# Patient Record
Sex: Female | Born: 1965 | Hispanic: No | Marital: Married | Smoking: Never smoker
Health system: Southern US, Community
[De-identification: ages and names within clinical notes are randomized; demographics above are authoritative.]

## PROBLEM LIST (undated history)

## (undated) DIAGNOSIS — E785 Hyperlipidemia, unspecified: Secondary | ICD-10-CM

## (undated) DIAGNOSIS — K5900 Constipation, unspecified: Secondary | ICD-10-CM

## (undated) DIAGNOSIS — E119 Type 2 diabetes mellitus without complications: Secondary | ICD-10-CM

## (undated) DIAGNOSIS — T7840XA Allergy, unspecified, initial encounter: Secondary | ICD-10-CM

## (undated) DIAGNOSIS — I10 Essential (primary) hypertension: Secondary | ICD-10-CM

## (undated) DIAGNOSIS — D649 Anemia, unspecified: Secondary | ICD-10-CM

## (undated) DIAGNOSIS — G629 Polyneuropathy, unspecified: Secondary | ICD-10-CM

## (undated) HISTORY — DX: Essential (primary) hypertension: I10

## (undated) HISTORY — DX: Anemia, unspecified: D64.9

## (undated) HISTORY — DX: Hyperlipidemia, unspecified: E78.5

## (undated) HISTORY — DX: Polyneuropathy, unspecified: G62.9

## (undated) HISTORY — DX: Constipation, unspecified: K59.00

## (undated) HISTORY — DX: Type 2 diabetes mellitus without complications: E11.9

## (undated) HISTORY — DX: Allergy, unspecified, initial encounter: T78.40XA

## (undated) HISTORY — PX: BREAST SURGERY: SHX581

---

## 1995-11-21 HISTORY — PX: TUBAL LIGATION: SHX77

## 2002-02-14 ENCOUNTER — Encounter: Admission: RE | Admit: 2002-02-14 | Discharge: 2002-02-14 | Payer: Self-pay | Admitting: Internal Medicine

## 2002-02-14 ENCOUNTER — Encounter: Payer: Self-pay | Admitting: Internal Medicine

## 2003-01-16 ENCOUNTER — Encounter: Admission: RE | Admit: 2003-01-16 | Discharge: 2003-01-16 | Payer: Self-pay | Admitting: Internal Medicine

## 2003-01-16 ENCOUNTER — Encounter: Payer: Self-pay | Admitting: Internal Medicine

## 2003-01-16 HISTORY — PX: BREAST CYST ASPIRATION: SHX578

## 2007-11-21 LAB — HM PAP SMEAR: HM Pap smear: NORMAL

## 2008-10-20 LAB — HM MAMMOGRAPHY

## 2008-11-16 ENCOUNTER — Ambulatory Visit (HOSPITAL_COMMUNITY): Admission: RE | Admit: 2008-11-16 | Discharge: 2008-11-16 | Payer: Self-pay | Admitting: Internal Medicine

## 2008-12-14 ENCOUNTER — Encounter: Admission: RE | Admit: 2008-12-14 | Discharge: 2008-12-14 | Payer: Self-pay | Admitting: Internal Medicine

## 2010-12-11 ENCOUNTER — Encounter: Payer: Self-pay | Admitting: Internal Medicine

## 2012-03-22 ENCOUNTER — Other Ambulatory Visit: Payer: Self-pay | Admitting: Internal Medicine

## 2012-03-22 DIAGNOSIS — R920 Mammographic microcalcification found on diagnostic imaging of breast: Secondary | ICD-10-CM

## 2012-06-05 ENCOUNTER — Other Ambulatory Visit: Payer: Self-pay | Admitting: Obstetrics & Gynecology

## 2012-06-05 DIAGNOSIS — R920 Mammographic microcalcification found on diagnostic imaging of breast: Secondary | ICD-10-CM

## 2012-06-10 ENCOUNTER — Ambulatory Visit
Admission: RE | Admit: 2012-06-10 | Discharge: 2012-06-10 | Disposition: A | Discharge status: Home / Self Care | Payer: PRIVATE HEALTH INSURANCE | Source: Ambulatory Visit | Attending: Obstetrics & Gynecology | Admitting: Obstetrics & Gynecology

## 2012-06-10 DIAGNOSIS — R920 Mammographic microcalcification found on diagnostic imaging of breast: Secondary | ICD-10-CM

## 2012-06-27 LAB — COMPREHENSIVE METABOLIC PANEL
ALT: 16 U/L (ref 7–35)
AST: 17 U/L
Calcium: 9.8 mg/dL
Chloride: 102 mmol/L
Creat: 0.79
Potassium: 4.1 mmol/L

## 2012-06-27 LAB — ESTRADIOL: Estradiol: 14.2

## 2012-06-27 LAB — THYROID PANEL WITH TSH
T3 Uptake: 28.6
T4, Total: 7.6

## 2012-06-27 LAB — RPR: RPR: NONREACTIVE

## 2012-06-27 LAB — LIPID PANEL
Total CHOL/HDL Ratio: 3.6
VLDL: 14 mg/dL

## 2012-07-12 ENCOUNTER — Ambulatory Visit (INDEPENDENT_AMBULATORY_CARE_PROVIDER_SITE_OTHER): Payer: BC Managed Care – PPO | Admitting: Internal Medicine

## 2012-07-12 ENCOUNTER — Encounter: Payer: Self-pay | Admitting: Internal Medicine

## 2012-07-12 VITALS — BP 150/102 | HR 74 | Temp 97.0°F | Resp 16 | Ht 62.0 in | Wt 161.0 lb

## 2012-07-12 DIAGNOSIS — E1110 Type 2 diabetes mellitus with ketoacidosis without coma: Secondary | ICD-10-CM

## 2012-07-12 DIAGNOSIS — I10 Essential (primary) hypertension: Secondary | ICD-10-CM

## 2012-07-12 DIAGNOSIS — E1310 Other specified diabetes mellitus with ketoacidosis without coma: Secondary | ICD-10-CM

## 2012-07-12 MED ORDER — AMLODIPINE BESYLATE-VALSARTAN 10-160 MG PO TABS: 1.0000 | ORAL_TABLET | Freq: Every day | ORAL | Status: DC

## 2012-07-12 NOTE — Patient Instructions (Signed)

## 2012-07-12 NOTE — Assessment & Plan Note (Signed)
She will start exforge to control her BP

## 2012-07-12 NOTE — Progress Notes (Signed)
  Subjective:    Patient ID: Alexandria Noble, female    DOB: 1966-07-31, 46 y.o.   MRN: [id number]  Hypertension This is a new problem. The current episode started today. The problem is unchanged. The problem is uncontrolled. Pertinent negatives include no anxiety, blurred vision, chest pain, headaches, malaise/fatigue, neck pain, orthopnea, palpitations, peripheral edema, PND, shortness of breath or sweats. There are no associated agents to hypertension. Past treatments include nothing. Compliance problems include exercise and diet.       Review of Systems  Constitutional: Negative.  Negative for malaise/fatigue.  HENT: Negative.  Negative for neck pain.   Eyes: Negative.  Negative for blurred vision.  Respiratory: Negative for cough, chest tightness, shortness of breath, wheezing and stridor.   Cardiovascular: Negative for chest pain, palpitations, orthopnea, leg swelling and PND.  Gastrointestinal: Negative.   Genitourinary: Negative for dysuria, urgency, frequency, hematuria, flank pain, decreased urine volume, enuresis, difficulty urinating and dyspareunia.  Musculoskeletal: Negative.   Skin: Negative.   Neurological: Negative for dizziness, facial asymmetry, light-headedness and headaches.  Hematological: Negative.   Psychiatric/Behavioral: Negative.        Objective:   Physical Exam  Vitals reviewed. Constitutional: She is oriented to person, place, and time. She appears well-developed and well-nourished. No distress.  HENT:  Head: Normocephalic and atraumatic.  Mouth/Throat: Oropharynx is clear and moist. No oropharyngeal exudate.  Eyes: Conjunctivae are normal. Right eye exhibits no discharge. Left eye exhibits no discharge. No scleral icterus.  Neck: Normal range of motion. Neck supple. No JVD present. No tracheal deviation present. No thyromegaly present.  Cardiovascular: Normal rate, regular rhythm, normal heart sounds and intact distal pulses.  Exam reveals no gallop  and no friction rub.   No murmur heard. Pulmonary/Chest: Effort normal and breath sounds normal. No stridor. No respiratory distress. She has no wheezes. She has no rales. She exhibits no tenderness.  Abdominal: Soft. Bowel sounds are normal. She exhibits no distension and no mass. There is no tenderness. There is no rebound and no guarding.  Musculoskeletal: Normal range of motion. She exhibits no edema and no tenderness.  Lymphadenopathy:    She has no cervical adenopathy.  Neurological: She is oriented to person, place, and time.  Skin: Skin is warm and dry. No rash noted. She is not diaphoretic. No erythema. No pallor.  Psychiatric: She has a normal mood and affect. Her behavior is normal. Judgment and thought content normal.     Lab Results  Component Value Date   CHOL 196 06/27/2012   TRIG 71 06/27/2012   HDL 54 06/27/2012   LDLCALC 409 06/27/2012   ALT 16 06/05/2012   AST 17 06/05/2012   NA 140 06/05/2012   K 4.1 06/05/2012   CL 102 06/05/2012   CREATININE 0.79 06/05/2012   BUN 18 06/05/2012   CO2 27 06/05/2012   TSH 3.631 06/06/2012       Assessment & Plan:

## 2014-03-04 ENCOUNTER — Other Ambulatory Visit: Payer: Self-pay

## 2014-03-26 ENCOUNTER — Ambulatory Visit (INDEPENDENT_AMBULATORY_CARE_PROVIDER_SITE_OTHER): Payer: BC Managed Care – PPO | Admitting: Obstetrics & Gynecology

## 2014-03-26 ENCOUNTER — Encounter: Payer: Self-pay | Admitting: Obstetrics & Gynecology

## 2014-03-26 VITALS — BP 146/102 | HR 85 | Temp 97.4°F | Ht 62.0 in | Wt 161.0 lb

## 2014-03-26 DIAGNOSIS — Z1231 Encounter for screening mammogram for malignant neoplasm of breast: Secondary | ICD-10-CM

## 2014-03-26 DIAGNOSIS — Z113 Encounter for screening for infections with a predominantly sexual mode of transmission: Secondary | ICD-10-CM

## 2014-03-26 DIAGNOSIS — Z780 Asymptomatic menopausal state: Secondary | ICD-10-CM

## 2014-03-26 DIAGNOSIS — Z01419 Encounter for gynecological examination (general) (routine) without abnormal findings: Secondary | ICD-10-CM

## 2014-03-26 NOTE — Progress Notes (Signed)
Subjective:     Alexandria Noble is a 48 y.o. female here for a routine exam.    Personal health questionnaire:  Is patient Ashkenazi Jewish, have a family history of breast and/or ovarian cancer: yes Is there a family history of uterine cancer diagnosed at age < 6050, gastrointestinal cancer, urinary tract cancer, family member who is a Personnel officerLynch syndrome-associated carrier: no Is the patient overweight and hypertensive, family history of diabetes, personal history of gestational diabetes or PCOS: yes Is patient over [address]55, have PCOS,  family history of premature CHD under age 48, diabetes, smoke, have hypertension or peripheral artery disease:  yes   Gynecologic History No LMP recorded. Patient is postmenopausal.  Last Pap Results were: abnormal Last mammogram results were: normal  Obstetric History OB History  Gravida Para Term Preterm AB SAB TAB Ectopic Multiple Living  3 2 2  1     2     # Outcome Date GA Lbr Len/2nd Weight Sex Delivery Anes PTL Lv  3 TRM 03/30/92 3130w0d  3.629 kg (8 lb) F SVD None N Y  2 TRM 12/27/87 4852w0d  2.835 kg (6 lb 4 oz) M SVD None N Y  1 ABT               Past Medical History  Diagnosis Date  . Hypertension     Past Surgical History  Procedure Laterality Date  . Tubal ligation  1997  . Breast surgery      No current outpatient prescriptions on file. No Known Allergies  History  Substance Use Topics  . Smoking status: Never Smoker   . Smokeless tobacco: Never Used  . Alcohol Use: No    Family History  Problem Relation Age of Onset  . Breast cancer Mother   . Heart disease Mother   . Hypertension Mother   . Heart disease Father   . Hypertension Father   . Stroke Maternal Grandmother   . Alcohol abuse Neg Hx   . Diabetes Neg Hx       Review of Systems  Constitutional: negative for fatigue and weight loss Respiratory: negative for cough and wheezing Cardiovascular: negative for chest pain, fatigue and palpitations Gastrointestinal:  negative for abdominal pain and change in bowel habits Musculoskeletal:negative for myalgias Neurological: negative for gait problems and tremors Behavioral/Psych: negative for abusive relationship, depression Endocrine: negative for temperature intolerance   Genitourinary:negative for abnormal menstrual periods, genital lesions, hot flashes, sexual problems and vaginal discharge Integument/breast: negative for breast lump, breast tenderness, nipple discharge and skin lesion(s)    Objective:       BP 146/102  Pulse 85  Temp(Src) 97.4 F (36.3 C)  Ht 5\' 2"  (1.575 m)  Wt 73.029 kg (161 lb)  BMI 29.44 kg/m2 General:   alert  Skin:   no rash or abnormalities  Lungs:   clear to auscultation bilaterally  Heart:   regular rate and rhythm, S1, S2 normal, no murmur, click, rub or gallop  Breasts:   normal without suspicious masses, skin or nipple changes or axillary nodes  Abdomen:  normal findings: no organomegaly, soft, non-tender and no hernia  Pelvis:  External genitalia: normal general appearance Urinary system: urethral meatus normal and bladder without fullness, nontender Vaginal: normal without tenderness, induration or masses Cervix: normal appearance Adnexa: normal bimanual exam Uterus: anteverted and non-tender, normal size   Lab Review  Labs reviewed no Radiologic studies reviewed no     Assessment:    Healthy female exam.  POF Plan:   Dondra SpryGail model testing done--referral-->[facility] for genetics counseling Counseled regarding, diet, exercise, bone health, screening MMG  No orders of the defined types were placed in this encounter.   Orders Placed This Encounter  Procedures  . DG Bone Density    EPIC ORDER PF NONE,     NO NEEDS    NP/PT    BCBS    Standing Status: Future     Number of Occurrences:      Standing Expiration Date: 05/27/2015    Order Specific Question:  Reason for Exam (SYMPTOM  OR DIAGNOSIS REQUIRED)    Answer:  postmenopausal ovarian  disfunction    Order Specific Question:  Is the patient pregnant?    Answer:  No    Order Specific Question:  Preferred [facility]?    [facility]  . HIV antibody    Follow up as needed.

## 2014-03-27 ENCOUNTER — Encounter: Payer: Self-pay | Admitting: Obstetrics & Gynecology

## 2014-03-27 ENCOUNTER — Telehealth: Payer: Self-pay | Admitting: Genetic Counselor

## 2014-03-27 LAB — HIV ANTIBODY (ROUTINE TESTING W REFLEX): HIV: NONREACTIVE

## 2014-03-27 NOTE — Telephone Encounter (Signed)
LEFT MESSAGE FOR PATIENT TO RETURN CALL TO SCHEDULE FOR GENETIC COUNSELING.

## 2014-04-02 ENCOUNTER — Other Ambulatory Visit: Payer: Self-pay

## 2014-04-02 DIAGNOSIS — Z1231 Encounter for screening mammogram for malignant neoplasm of breast: Secondary | ICD-10-CM

## 2014-04-02 DIAGNOSIS — Z803 Family history of malignant neoplasm of breast: Secondary | ICD-10-CM

## 2014-04-06 ENCOUNTER — Telehealth: Payer: Self-pay | Admitting: Genetic Counselor

## 2014-04-06 NOTE — Telephone Encounter (Signed)
LEFT MESSAGE FOR PATIENT TO RETURN CALL TO SCHEDULE GENETIC APPT.  °

## 2014-04-23 ENCOUNTER — Ambulatory Visit
Admission: RE | Admit: 2014-04-23 | Discharge: 2014-04-23 | Disposition: A | Discharge status: Home / Self Care | Payer: BC Managed Care – PPO | Source: Ambulatory Visit

## 2014-04-23 ENCOUNTER — Ambulatory Visit
Admission: RE | Admit: 2014-04-23 | Discharge: 2014-04-23 | Disposition: A | Discharge status: Home / Self Care | Payer: BC Managed Care – PPO | Source: Ambulatory Visit | Attending: Obstetrics & Gynecology | Admitting: Obstetrics & Gynecology

## 2014-04-23 DIAGNOSIS — Z803 Family history of malignant neoplasm of breast: Secondary | ICD-10-CM

## 2014-04-23 DIAGNOSIS — Z1231 Encounter for screening mammogram for malignant neoplasm of breast: Secondary | ICD-10-CM

## 2014-04-23 DIAGNOSIS — Z780 Asymptomatic menopausal state: Secondary | ICD-10-CM

## 2014-04-27 ENCOUNTER — Other Ambulatory Visit: Payer: Self-pay | Admitting: Obstetrics & Gynecology

## 2014-04-27 DIAGNOSIS — R928 Other abnormal and inconclusive findings on diagnostic imaging of breast: Secondary | ICD-10-CM

## 2014-05-01 ENCOUNTER — Ambulatory Visit
Admission: RE | Admit: 2014-05-01 | Discharge: 2014-05-01 | Disposition: A | Discharge status: Home / Self Care | Payer: BC Managed Care – PPO | Source: Ambulatory Visit | Attending: Obstetrics & Gynecology | Admitting: Obstetrics & Gynecology

## 2014-05-01 DIAGNOSIS — R928 Other abnormal and inconclusive findings on diagnostic imaging of breast: Secondary | ICD-10-CM

## 2014-05-04 ENCOUNTER — Telehealth: Payer: Self-pay | Admitting: *Deleted

## 2014-05-04 ENCOUNTER — Other Ambulatory Visit: Payer: Self-pay | Admitting: *Deleted

## 2014-05-04 NOTE — Telephone Encounter (Signed)
If Rx to be sent to pharmace, [facility] [address]

## 2014-05-04 NOTE — Telephone Encounter (Signed)
Pt called into office requesting refill on her Brisdelle 7.5mg .  Return call to pt.  Pt states that she was given samples in office.  Pt would like to know if we need to send Rx to pharmacy or can she get more samples.  Please advise.

## 2014-05-06 ENCOUNTER — Encounter: Payer: Self-pay | Admitting: Obstetrics & Gynecology

## 2014-05-07 ENCOUNTER — Encounter: Payer: Self-pay | Admitting: *Deleted

## 2014-05-07 NOTE — Telephone Encounter (Signed)
Pt calling back in regards to refill of Brisdelle.  Please advise

## 2014-05-11 ENCOUNTER — Encounter: Payer: Self-pay | Admitting: Obstetrics & Gynecology

## 2014-05-13 ENCOUNTER — Other Ambulatory Visit: Payer: Self-pay | Admitting: *Deleted

## 2014-05-13 MED ORDER — PAROXETINE MESYLATE 7.5 MG PO CAPS: 1.0000 | ORAL_CAPSULE | Freq: Every day | ORAL | Status: AC

## 2014-05-13 NOTE — Telephone Encounter (Signed)
Rx has been sent to pharmacy.  Pt made aware and that it may also require prior approval due to insurance.  Pt states understanding.  Pt has also picked up additional samples until it can be approved.

## 2014-05-13 NOTE — Telephone Encounter (Signed)
OK to refill

## 2014-09-21 ENCOUNTER — Encounter: Payer: Self-pay | Admitting: Obstetrics & Gynecology

## 2014-09-24 ENCOUNTER — Other Ambulatory Visit: Payer: Self-pay | Admitting: Obstetrics & Gynecology

## 2014-09-24 DIAGNOSIS — N6310 Unspecified lump in the right breast, unspecified quadrant: Secondary | ICD-10-CM

## 2014-10-27 ENCOUNTER — Other Ambulatory Visit: Payer: Self-pay

## 2014-10-27 ENCOUNTER — Ambulatory Visit
Admission: RE | Admit: 2014-10-27 | Discharge: 2014-10-27 | Disposition: A | Discharge status: Home / Self Care | Payer: BC Managed Care – PPO | Source: Ambulatory Visit | Attending: Obstetrics & Gynecology | Admitting: Obstetrics & Gynecology

## 2014-10-27 ENCOUNTER — Other Ambulatory Visit: Payer: Self-pay | Admitting: Obstetrics & Gynecology

## 2014-10-27 DIAGNOSIS — N6310 Unspecified lump in the right breast, unspecified quadrant: Secondary | ICD-10-CM

## 2014-11-16 ENCOUNTER — Encounter: Payer: Self-pay | Admitting: *Deleted

## 2014-11-17 ENCOUNTER — Encounter: Payer: Self-pay | Admitting: Obstetrics & Gynecology

## 2014-12-29 ENCOUNTER — Ambulatory Visit: Payer: Self-pay | Admitting: Family Medicine

## 2015-01-08 ENCOUNTER — Inpatient Hospital Stay (HOSPITAL_COMMUNITY)
Admission: EM | Admit: 2015-01-08 | Discharge: 2015-01-10 | DRG: 639 | Disposition: A | Discharge status: Home / Self Care | Payer: 59 | Attending: Internal Medicine | Admitting: Internal Medicine

## 2015-01-08 ENCOUNTER — Encounter (HOSPITAL_COMMUNITY): Payer: Self-pay | Admitting: Internal Medicine

## 2015-01-08 DIAGNOSIS — E1310 Other specified diabetes mellitus with ketoacidosis without coma: Secondary | ICD-10-CM | POA: Diagnosis not present

## 2015-01-08 DIAGNOSIS — Z803 Family history of malignant neoplasm of breast: Secondary | ICD-10-CM

## 2015-01-08 DIAGNOSIS — E860 Dehydration: Secondary | ICD-10-CM | POA: Diagnosis present

## 2015-01-08 DIAGNOSIS — R9431 Abnormal electrocardiogram [ECG] [EKG]: Secondary | ICD-10-CM | POA: Diagnosis present

## 2015-01-08 DIAGNOSIS — Z823 Family history of stroke: Secondary | ICD-10-CM

## 2015-01-08 DIAGNOSIS — E1110 Type 2 diabetes mellitus with ketoacidosis without coma: Secondary | ICD-10-CM

## 2015-01-08 DIAGNOSIS — E876 Hypokalemia: Secondary | ICD-10-CM | POA: Diagnosis present

## 2015-01-08 DIAGNOSIS — I10 Essential (primary) hypertension: Secondary | ICD-10-CM

## 2015-01-08 LAB — COMPREHENSIVE METABOLIC PANEL
ALBUMIN: 4.9 g/dL (ref 3.5–5.2)
ALT: 33 U/L (ref 0–35)
ANION GAP: 23 — AB (ref 5–15)
AST: 28 U/L (ref 0–37)
Alkaline Phosphatase: 149 U/L — ABNORMAL HIGH (ref 39–117)
BUN: 21 mg/dL (ref 6–23)
CO2: 15 mmol/L — ABNORMAL LOW (ref 19–32)
CREATININE: 1.22 mg/dL — AB (ref 0.50–1.10)
Calcium: 10.7 mg/dL — ABNORMAL HIGH (ref 8.4–10.5)
Chloride: 98 mmol/L (ref 96–112)
GFR calc Af Amer: 60 mL/min — ABNORMAL LOW (ref >90)
GFR calc non Af Amer: 52 mL/min — ABNORMAL LOW (ref >90)
Glucose, Bld: 899 mg/dL (ref 70–99)
Potassium: 4.7 mmol/L (ref 3.5–5.1)
Sodium: 136 mmol/L (ref 135–145)
TOTAL PROTEIN: 9.2 g/dL — AB (ref 6.0–8.3)
Total Bilirubin: 1.8 mg/dL — ABNORMAL HIGH (ref 0.3–1.2)

## 2015-01-08 LAB — CBC
HCT: 47.3 % — ABNORMAL HIGH (ref 36.0–46.0)
HEMOGLOBIN: 15.4 g/dL — AB (ref 12.0–15.0)
MCH: 26.7 pg (ref 26.0–34.0)
MCHC: 32.6 g/dL (ref 30.0–36.0)
MCV: 82.1 fL (ref 78.0–100.0)
PLATELETS: 282 10*3/uL (ref 150–400)
RBC: 5.76 MIL/uL — ABNORMAL HIGH (ref 3.87–5.11)
RDW: 14 % (ref 11.5–15.5)
WBC: 9.1 10*3/uL (ref 4.0–10.5)

## 2015-01-08 LAB — URINALYSIS, ROUTINE W REFLEX MICROSCOPIC
Bilirubin Urine: NEGATIVE
HGB URINE DIPSTICK: NEGATIVE
Ketones, ur: >80 mg/dL — AB
Leukocytes, UA: NEGATIVE
Nitrite: NEGATIVE
PH: 5 (ref 5.0–8.0)
Protein, ur: NEGATIVE mg/dL
SPECIFIC GRAVITY, URINE: 1.037 — AB (ref 1.005–1.030)
Urobilinogen, UA: 0.2 mg/dL (ref 0.0–1.0)

## 2015-01-08 LAB — CBG MONITORING, ED
GLUCOSE-CAPILLARY: 568 mg/dL — AB (ref 70–99)
Glucose-Capillary: >600 mg/dL (ref 70–99)
Glucose-Capillary: 499 mg/dL — ABNORMAL HIGH (ref 70–99)

## 2015-01-08 LAB — URINE MICROSCOPIC-ADD ON

## 2015-01-08 LAB — BLOOD GAS, VENOUS
Acid-base deficit: 9 mmol/L — ABNORMAL HIGH (ref 0.0–2.0)
Bicarbonate: 16.8 mEq/L — ABNORMAL LOW (ref 20.0–24.0)
Drawn by: 361731
FIO2: 0.21 %
O2 Saturation: 66.7 %
PATIENT TEMPERATURE: 98
TCO2: 15 mmol/L (ref 0–100)
pCO2, Ven: 36.7 mmHg — ABNORMAL LOW (ref 45.0–50.0)
pH, Ven: 7.282 (ref 7.250–7.300)

## 2015-01-08 MED ORDER — SODIUM CHLORIDE 0.9 % IV BOLUS (SEPSIS)
1000.0000 mL | Freq: Once | INTRAVENOUS | Status: AC
  Administered 2015-01-08: 1000 mL via INTRAVENOUS

## 2015-01-08 MED ORDER — DEXTROSE-NACL 5-0.45 % IV SOLN: INTRAVENOUS | Status: DC

## 2015-01-08 MED ORDER — SODIUM CHLORIDE 0.9 % IV SOLN
INTRAVENOUS | Status: DC
  Administered 2015-01-08: 5.1 [IU]/h via INTRAVENOUS
  Filled 2015-01-08: qty 2.5

## 2015-01-08 NOTE — ED Provider Notes (Signed)
CSN: [id number]     Arrival date & time 01/08/15  1806 History   First MD Initiated Contact with Patient 01/08/15 1929     Chief Complaint  Patient presents with  . Hyperglycemia   HPI  Patient is a 49 year old female with past medical history of hypertension who presents emergency room at the request of her PCP for hyperglycemia. Patient states that she was seen yesterday in the office by Virl Sonammy Boyd who ran blood work and called her today and told her that her blood sugar was very high and told her to go to the emergency room. Patient states that for the past 8 weeks she has had cottonmouth, has had increased urge to drink water, frequent urination, weight loss without trying, and some blurry vision. She also feels that she has more short of breath. She did not have a primary care doctor for a time due to no insurance. Patient has no history of diabetes. Patient states that she has a strong family history of diabetes.  Past Medical History  Diagnosis Date  . Hypertension    Past Surgical History  Procedure Laterality Date  . Tubal ligation  1997  . Breast surgery     Family History  Problem Relation Age of Onset  . Breast cancer Mother   . Heart disease Mother   . Hypertension Mother   . Diabetes type II Mother   . Heart disease Father   . Hypertension Father   . Stroke Maternal Grandmother   . Alcohol abuse Neg Hx   . Diabetes Neg Hx   . Breast cancer Sister   . Diabetes type II Sister    History  Substance Use Topics  . Smoking status: Never Smoker   . Smokeless tobacco: Never Used  . Alcohol Use: 0.0 oz/week    0 Glasses of wine per week     Comment: occasionaly   OB History    Gravida Para Term Preterm AB TAB SAB Ectopic Multiple Living   3 2 2  1     2      Review of Systems  Constitutional: Positive for fatigue and unexpected weight change.  Eyes: Positive for visual disturbance.  Respiratory: Positive for shortness of breath.   Cardiovascular: Negative for  chest pain.  Gastrointestinal: Positive for constipation. Negative for nausea, vomiting, abdominal pain, diarrhea and blood in stool.  Endocrine: Positive for polydipsia, polyphagia and polyuria.  Genitourinary: Positive for frequency. Negative for dysuria, urgency, hematuria, flank pain and difficulty urinating.  Neurological: Positive for dizziness and light-headedness.  All other systems reviewed and are negative.     Allergies  Review of patient's allergies indicates no known allergies.  Home Medications   Prior to Admission medications   Medication Sig Start Date End Date Taking? Authorizing Provider  PARoxetine Mesylate 7.5 MG CAPS Take 1 capsule by mouth at bedtime. Patient not taking: Reported on 01/08/2015 05/13/14   Antionette CharLisa Jackson-Moore, MD   BP 106/77 mmHg  Pulse 82  Temp(Src) 98 F (36.7 C) (Oral)  Resp 16  SpO2 100% Physical Exam  Constitutional: She is oriented to person, place, and time. She appears well-developed and well-nourished. No distress.  HENT:  Head: Normocephalic and atraumatic.  Mouth/Throat: No oropharyngeal exudate.  Mucous membrane dry  Eyes: Conjunctivae and EOM are normal. Pupils are equal, round, and reactive to light. No scleral icterus.  Neck: Normal range of motion. Neck supple. No JVD present. No thyromegaly present.  Cardiovascular: Normal rate, regular rhythm, normal heart  sounds and intact distal pulses.  Exam reveals no gallop and no friction rub.   No murmur heard. Pulmonary/Chest: Effort normal and breath sounds normal. No respiratory distress. She has no wheezes. She has no rales. She exhibits no tenderness.  Abdominal: Soft. Bowel sounds are normal. She exhibits no distension and no mass. There is no tenderness. There is no rebound and no guarding.  Musculoskeletal: Normal range of motion.  Lymphadenopathy:    She has no cervical adenopathy.  Neurological: She is alert and oriented to person, place, and time. She has normal strength.  No cranial nerve deficit or sensory deficit. Coordination normal.  Skin: Skin is warm and dry. She is not diaphoretic.  Psychiatric: She has a normal mood and affect. Her behavior is normal. Judgment and thought content normal.  Nursing note and vitals reviewed.   ED Course  Procedures   CRITICAL CARE Performed by: Terri Piedra A   Total critical care time: 45  Critical care time was exclusive of separately billable procedures and treating other patients.  Critical care was necessary to treat or prevent imminent or life-threatening deterioration.  Critical care was time spent personally by me on the following activities: development of treatment plan with patient and/or surrogate as well as nursing, discussions with consultants, evaluation of patient's response to treatment, examination of patient, obtaining history from patient or surrogate, ordering and performing treatments and interventions, ordering and review of laboratory studies, ordering and review of radiographic studies, pulse oximetry and re-evaluation of patient's condition.  Labs Review Labs Reviewed  COMPREHENSIVE METABOLIC PANEL - Abnormal; Notable for the following:    CO2 15 (*)    Glucose, Bld 899 (*)    Creatinine, Ser 1.22 (*)    Calcium 10.7 (*)    Total Protein 9.2 (*)    Alkaline Phosphatase 149 (*)    Total Bilirubin 1.8 (*)    GFR calc non Af Amer 52 (*)    GFR calc Af Amer 60 (*)    Anion gap 23 (*)    All other components within normal limits  CBC - Abnormal; Notable for the following:    RBC 5.76 (*)    Hemoglobin 15.4 (*)    HCT 47.3 (*)    All other components within normal limits  URINALYSIS, ROUTINE W REFLEX MICROSCOPIC - Abnormal; Notable for the following:    Specific Gravity, Urine 1.037 (*)    Glucose, UA >1000 (*)    Ketones, ur >80 (*)    All other components within normal limits  BLOOD GAS, VENOUS - Abnormal; Notable for the following:    pCO2, Ven 36.7 (*)    Bicarbonate  16.8 (*)    Acid-base deficit 9.0 (*)    All other components within normal limits  CBG MONITORING, ED - Abnormal; Notable for the following:    Glucose-Capillary >600 (*)    All other components within normal limits  CBG MONITORING, ED - Abnormal; Notable for the following:    Glucose-Capillary 568 (*)    All other components within normal limits  CBG MONITORING, ED - Abnormal; Notable for the following:    Glucose-Capillary 499 (*)    All other components within normal limits  CBG MONITORING, ED - Abnormal; Notable for the following:    Glucose-Capillary 360 (*)    All other components within normal limits  URINE MICROSCOPIC-ADD ON  BASIC METABOLIC PANEL  CBG MONITORING, ED  I-STAT TROPOININ, ED    Imaging Review No results found.  EKG Interpretation   Date/Time:  Friday January 08 2015 23:38:42 EST Ventricular Rate:  90 PR Interval:  130 QRS Duration: 88 QT Interval:  395 QTC Calculation: 483 R Axis:   31 Text Interpretation:  Sinus rhythm Abnormal T, consider ischemia, anterior  leads No old tracing to compare Confirmed by JACUBOWITZ  MD, SAM [phone])  on 01/08/2015 11:45:05 PM      MDM   Final diagnoses:  Diabetic ketoacidosis without coma associated with type 2 diabetes mellitus  Abnormal EKG  Dehydration   Patient is a 49 year old female who presents emergency room for evaluation of hyperglycemia. On physical examination patient is alert and oriented 4 and without significant abnormality. She does appear to be fatigued. He is members are dry. Vital signs are stable. CMP reveals hyperglycemia 899. She does have an anion gap of 23. The ABG reveals metabolic acidosis. UA reveals ketones. CBC likely hemoconcentrated. EKG does reveal some abnormal T waves, but there are no old tracings to compare to. I-STAT troponin is pending. I spoken with Dr. Adela Glimpse [facility] who will admit the patient to telemetry. Patient given 2 L normal saline bolus  here and started on a glucose stabilizer. Patient is in DKA at this time. Patient seen by and discussed with Dr. Rennis Chris.    Eben Burow, PA-C 01/09/15 0025  Doug Sou, MD 01/09/15 0030

## 2015-01-08 NOTE — H&P (Signed)
PCP: [name] at [facility]   Chief Complaint:  Elevated blood glucose  HPI: Alexandria Noble is a 49 y.o. female   has a past medical history of Hypertension.   Presented with  Patient reports hx of 2 weeks of dry mouth, increased urination and increased thirst, sluggish, blurred vision, decreased energy. She has lost 15 lb. She presented to her PCP for the first time yesterday 2/18 and had some blood work done showing elevated BG up to 600. She was told to come to ER. At [facility] ER she was noted to have BG 899, cr 1.2 CO2 15 with AG 23. She was given iVF and started on glucose stabilizer recent BG down to 360.  Hospitalist was called for admission for  New diagnosis of DM requiring insulin.   Review of Systems:    Pertinent positives include:  Increased thirst and urination   Constitutional:  No weight loss, night sweats, Fevers, chills, fatigue, weight loss  HEENT:  No headaches, Difficulty swallowing,Tooth/dental problems,Sore throat,  No sneezing, itching, ear ache, nasal congestion, post nasal drip,  Cardio-vascular:  No chest pain, Orthopnea, PND, anasarca, dizziness, palpitations.no Bilateral lower extremity swelling  GI:  No heartburn, indigestion, abdominal pain, nausea, vomiting, diarrhea, change in bowel habits, loss of appetite, melena, blood in stool, hematemesis Resp:  no shortness of breath at rest. No dyspnea on exertion, No excess mucus, no productive cough, No non-productive cough, No coughing up of blood.No change in color of mucus.No wheezing. Skin:  no rash or lesions. No jaundice GU:  no dysuria, change in color of urine, no urgency or frequency. No straining to urinate.  No flank pain.  Musculoskeletal:  No joint pain or no joint swelling. No decreased range of motion. No back pain.  Psych:  No change in mood or affect. No depression or anxiety. No memory loss.  Neuro: no localizing neurological complaints, no tingling, no weakness, no double vision, no gait  abnormality, no slurred speech, no confusion  Otherwise ROS are negative except for above, 10 systems were reviewed  Past Medical History: Past Medical History  Diagnosis Date  . Hypertension    Past Surgical History  Procedure Laterality Date  . Tubal ligation  1997  . Breast surgery       Medications: Prior to Admission medications   Medication Sig Start Date End Date Taking? Authorizing Provider  PARoxetine Mesylate 7.5 MG CAPS Take 1 capsule by mouth at bedtime. Patient not taking: Reported on 01/08/2015 05/13/14   Antionette Char, MD    Allergies:  No Known Allergies  Social History:  Ambulatory   independently   Lives at home    With family     reports that she has never smoked. She has never used smokeless tobacco. She reports that she does not drink alcohol or use illicit drugs.    Family History: family history includes Breast cancer in her mother; Heart disease in her father and mother; Hypertension in her father and mother; Stroke in her maternal grandmother. There is no history of Alcohol abuse or Diabetes.    Physical Exam: Patient Vitals for the past 24 hrs:  BP Temp Temp src Pulse Resp SpO2  01/08/15 2200 106/77 mmHg - - 82 - 100 %  01/08/15 2130 113/84 mmHg - - 81 - 100 %  01/08/15 2100 116/79 mmHg - - 81 - 100 %  01/08/15 2030 116/77 mmHg - - 79 - 99 %  01/08/15 2000 120/86 mmHg - - 80 -  99 %  01/08/15 1939 120/82 mmHg 98 F (36.7 C) Oral 90 16 98 %  01/08/15 1835 (!) 128/102 mmHg - - - - -  01/08/15 1820 (!) 149/108 mmHg 97.9 F (36.6 C) Oral 95 18 99 %    1. General:  in No Acute distress 2. Psychological: Alert and   Oriented 3. Head/ENT:    Dry Mucous Membranes                          Head Non traumatic, neck supple                          Normal   Dentition 4. SKIN:  decreased Skin turgor,  Skin clean Dry and intact no rash 5. Heart: Regular rate and rhythm no Murmur, Rub or gallop 6. Lungs: Clear to auscultation bilaterally, no  wheezes or crackles   7. Abdomen: Soft, non-tender, Non distended 8. Lower extremities: no clubbing, cyanosis, or edema 9. Neurologically Grossly intact, moving all 4 extremities equally 10. MSK: Normal range of motion  body mass index is unknown because there is no weight on file.   Labs on Admission:   Results for orders placed or performed during the hospital encounter of 01/08/15 (from the past 24 hour(s))  CBG monitoring, ED     Status: Abnormal   Collection Time: 01/08/15  6:24 PM  Result Value Ref Range   Glucose-Capillary >600 (HH) 70 - 99 mg/dL   Comment 1 Notify RN    Comment 2 Document in Chart   Comprehensive metabolic panel     Status: Abnormal   Collection Time: 01/08/15  6:48 PM  Result Value Ref Range   Sodium 136 135 - 145 mmol/L   Potassium 4.7 3.5 - 5.1 mmol/L   Chloride 98 96 - 112 mmol/L   CO2 15 (L) 19 - 32 mmol/L   Glucose, Bld 899 (HH) 70 - 99 mg/dL   BUN 21 6 - 23 mg/dL   Creatinine, Ser 1.611.22 (H) 0.50 - 1.10 mg/dL   Calcium 09.610.7 (H) 8.4 - 10.5 mg/dL   Total Protein 9.2 (H) 6.0 - 8.3 g/dL   Albumin 4.9 3.5 - 5.2 g/dL   AST 28 0 - 37 U/L   ALT 33 0 - 35 U/L   Alkaline Phosphatase 149 (H) 39 - 117 U/L   Total Bilirubin 1.8 (H) 0.3 - 1.2 mg/dL   GFR calc non Af Amer 52 (L) >90 mL/min   GFR calc Af Amer 60 (L) >90 mL/min   Anion gap 23 (H) 5 - 15  CBC     Status: Abnormal   Collection Time: 01/08/15  6:48 PM  Result Value Ref Range   WBC 9.1 4.0 - 10.5 K/uL   RBC 5.76 (H) 3.87 - 5.11 MIL/uL   Hemoglobin 15.4 (H) 12.0 - 15.0 g/dL   HCT 04.547.3 (H) 40.936.0 - 81.146.0 %   MCV 82.1 78.0 - 100.0 fL   MCH 26.7 26.0 - 34.0 pg   MCHC 32.6 30.0 - 36.0 g/dL   RDW 91.414.0 78.211.5 - 95.615.5 %   Platelets 282 150 - 400 K/uL  Urinalysis, Routine w reflex microscopic     Status: Abnormal   Collection Time: 01/08/15  6:54 PM  Result Value Ref Range   Color, Urine YELLOW YELLOW   APPearance CLEAR CLEAR   Specific Gravity, Urine 1.037 (H) 1.005 - 1.030   pH 5.0  5.0 - 8.0    Glucose, UA >1000 (A) NEGATIVE mg/dL   Hgb urine dipstick NEGATIVE NEGATIVE   Bilirubin Urine NEGATIVE NEGATIVE   Ketones, ur >80 (A) NEGATIVE mg/dL   Protein, ur NEGATIVE NEGATIVE mg/dL   Urobilinogen, UA 0.2 0.0 - 1.0 mg/dL   Nitrite NEGATIVE NEGATIVE   Leukocytes, UA NEGATIVE NEGATIVE  Urine microscopic-add on     Status: None   Collection Time: 01/08/15  6:54 PM  Result Value Ref Range   Squamous Epithelial / LPF RARE RARE   WBC, UA 0-2 <3 WBC/hpf   Bacteria, UA RARE RARE  Blood gas, venous     Status: Abnormal   Collection Time: 01/08/15  8:06 PM  Result Value Ref Range   FIO2 0.21 %   pH, Ven 7.282 7.250 - 7.300   pCO2, Ven 36.7 (L) 45.0 - 50.0 mmHg   pO2, Ven BELOW REPORTABLE RANGE.  30.0 - 45.0 mmHg   Bicarbonate 16.8 (L) 20.0 - 24.0 mEq/L   TCO2 15.0 0 - 100 mmol/L   Acid-base deficit 9.0 (H) 0.0 - 2.0 mmol/L   O2 Saturation 66.7 %   Patient temperature 98.0    Collection site VEIN    Drawn by [id number]    Sample type VEIN   POC CBG, ED     Status: Abnormal   Collection Time: 01/08/15 10:10 PM  Result Value Ref Range   Glucose-Capillary 568 (HH) 70 - 99 mg/dL   Comment 1 Notify RN   POC CBG, ED     Status: Abnormal   Collection Time: 01/08/15 11:07 PM  Result Value Ref Range   Glucose-Capillary 499 (H) 70 - 99 mg/dL    UA ketones >04, glucose >1000  No results found for: HGBA1C  CrCl cannot be calculated (Unknown ideal weight.).  BNP (last 3 results) No results for input(s): PROBNP in the last 8760 hours.  Other results:  I have pearsonaly reviewed this: ECG REPORT  Rate: 90  Rhythm: nSR ST&T Change: T wave inversion in V1 - V4   There were no vitals filed for this visit.   Cultures: No results found for: SDES, SPECREQUEST, CULT, REPTSTATUS   Radiological Exams on Admission: No results found.  Chart has been reviewed  Assessment/Plan  49 year old female with new diagnosis of diabetes uncontrolled appears to be in ketoacidosis admitted on  glucose stabilizer  Present on Admission:  . DKA, type 2, not at goal - will admit per DKA protocol, obtain serial BMET, start on glucosestabalizer, aggressive IVF. Replace potassium as needed. . DM (diabetes mellitus) type 2, uncontrolled, with ketoacidosis - new diagnosis will order diabetic coordinator consult  . Abnormal ECG - patient does not endorse any chest pain no old EKG on chart. We'll cycle cardiac enzymes monitor on telemetry  . Dehydration - administer IV fluids     Prophylaxis:   Lovenox  CODE STATUS:  FULL CODE    Other plan as per orders.  I have spent a total of 55 min on this admission  Justis Dupas 01/08/2015, 11:57 PM  [facility]  Pager [phone]   after 2 AM please page floor coverage PA If 7AM-7PM, please contact the day team taking care of the patient  [email or web address]  Password [email or web address]

## 2015-01-08 NOTE — ED Notes (Signed)
Pt reports she was getting a new pcp and had blood work done. Glucose was >600. pcp called and told pt to come to ED. Reports urinary frequency. Denies pain. Denies n/v/d.

## 2015-01-08 NOTE — ED Provider Notes (Signed)
Complains of increased thirst and polyuria for prostate 2 weeks. No pain no fever no shortness of breath no other associated symptoms. She had lab work done yesterday by her PCP. Was noted to have elevated blood sugar was told to Come to the emergency department for further evaluation and treatment. Presently patient is alert Glasgow Coma Score 15 HEENT mucous members dry. Nonicteric neck supple trachea midline lungs clear auscultation abdomen nondistended nontender heart regular rate and rhythm, extremities without edema skin warm dry no rash Lab work is consistent with diabetic ketoacidosis  Doug SouSam Vikkie Goeden, MD 01/09/15 0030

## 2015-01-08 NOTE — ED Notes (Signed)
Daniel, RT called and made aware of order for VBG

## 2015-01-09 ENCOUNTER — Encounter (HOSPITAL_COMMUNITY): Payer: Self-pay | Admitting: Internal Medicine

## 2015-01-09 DIAGNOSIS — E1110 Type 2 diabetes mellitus with ketoacidosis without coma: Secondary | ICD-10-CM | POA: Diagnosis present

## 2015-01-09 DIAGNOSIS — E860 Dehydration: Secondary | ICD-10-CM

## 2015-01-09 DIAGNOSIS — E1310 Other specified diabetes mellitus with ketoacidosis without coma: Principal | ICD-10-CM

## 2015-01-09 LAB — BASIC METABOLIC PANEL
ANION GAP: 6 (ref 5–15)
Anion gap: 14 (ref 5–15)
Anion gap: 8 (ref 5–15)
Anion gap: 8 (ref 5–15)
Anion gap: 6 (ref 5–15)
BUN: 19 mg/dL (ref 6–23)
BUN: 19 mg/dL (ref 6–23)
BUN: 21 mg/dL (ref 6–23)
BUN: 21 mg/dL (ref 6–23)
BUN: 22 mg/dL (ref 6–23)
CALCIUM: 9.3 mg/dL (ref 8.4–10.5)
CHLORIDE: 111 mmol/L (ref 96–112)
CHLORIDE: 112 mmol/L (ref 96–112)
CO2: 19 mmol/L (ref 19–32)
CO2: 23 mmol/L (ref 19–32)
CO2: 24 mmol/L (ref 19–32)
CO2: 26 mmol/L (ref 19–32)
CO2: 24 mmol/L (ref 19–32)
CREATININE: 0.69 mg/dL (ref 0.50–1.10)
Calcium: 9.8 mg/dL (ref 8.4–10.5)
Calcium: 9.7 mg/dL (ref 8.4–10.5)
Calcium: 9.2 mg/dL (ref 8.4–10.5)
Calcium: 9 mg/dL (ref 8.4–10.5)
Chloride: 115 mmol/L — ABNORMAL HIGH (ref 96–112)
Chloride: 111 mmol/L (ref 96–112)
Chloride: 110 mmol/L (ref 96–112)
Creatinine, Ser: 1.09 mg/dL (ref 0.50–1.10)
Creatinine, Ser: 0.79 mg/dL (ref 0.50–1.10)
Creatinine, Ser: 0.78 mg/dL (ref 0.50–1.10)
Creatinine, Ser: 0.69 mg/dL (ref 0.50–1.10)
GFR calc Af Amer: >90 mL/min (ref >90)
GFR calc Af Amer: >90 mL/min (ref >90)
GFR calc Af Amer: >90 mL/min (ref >90)
GFR calc non Af Amer: 59 mL/min — ABNORMAL LOW (ref >90)
GFR calc non Af Amer: >90 mL/min (ref >90)
GFR calc non Af Amer: >90 mL/min (ref >90)
GFR, EST AFRICAN AMERICAN: 68 mL/min — AB (ref >90)
Glucose, Bld: 389 mg/dL — ABNORMAL HIGH (ref 70–99)
Glucose, Bld: 233 mg/dL — ABNORMAL HIGH (ref 70–99)
Glucose, Bld: 174 mg/dL — ABNORMAL HIGH (ref 70–99)
Glucose, Bld: 102 mg/dL — ABNORMAL HIGH (ref 70–99)
Glucose, Bld: 180 mg/dL — ABNORMAL HIGH (ref 70–99)
POTASSIUM: 3.8 mmol/L (ref 3.5–5.1)
POTASSIUM: 4 mmol/L (ref 3.5–5.1)
Potassium: 3.2 mmol/L — ABNORMAL LOW (ref 3.5–5.1)
Potassium: 3 mmol/L — ABNORMAL LOW (ref 3.5–5.1)
Potassium: 3.7 mmol/L (ref 3.5–5.1)
SODIUM: 144 mmol/L (ref 135–145)
Sodium: 148 mmol/L — ABNORMAL HIGH (ref 135–145)
Sodium: 142 mmol/L (ref 135–145)
Sodium: 143 mmol/L (ref 135–145)
Sodium: 140 mmol/L (ref 135–145)

## 2015-01-09 LAB — GLUCOSE, CAPILLARY
GLUCOSE-CAPILLARY: 287 mg/dL — AB (ref 70–99)
GLUCOSE-CAPILLARY: 250 mg/dL — AB (ref 70–99)
GLUCOSE-CAPILLARY: 159 mg/dL — AB (ref 70–99)
GLUCOSE-CAPILLARY: 91 mg/dL (ref 70–99)
GLUCOSE-CAPILLARY: 169 mg/dL — AB (ref 70–99)
Glucose-Capillary: 324 mg/dL — ABNORMAL HIGH (ref 70–99)
Glucose-Capillary: 186 mg/dL — ABNORMAL HIGH (ref 70–99)
Glucose-Capillary: 118 mg/dL — ABNORMAL HIGH (ref 70–99)
Glucose-Capillary: 78 mg/dL (ref 70–99)
Glucose-Capillary: 231 mg/dL — ABNORMAL HIGH (ref 70–99)
Glucose-Capillary: 200 mg/dL — ABNORMAL HIGH (ref 70–99)
Glucose-Capillary: 137 mg/dL — ABNORMAL HIGH (ref 70–99)
Glucose-Capillary: 105 mg/dL — ABNORMAL HIGH (ref 70–99)
Glucose-Capillary: 118 mg/dL — ABNORMAL HIGH (ref 70–99)
Glucose-Capillary: 379 mg/dL — ABNORMAL HIGH (ref 70–99)

## 2015-01-09 LAB — TROPONIN I
Troponin I: <0.03 ng/mL (ref <0.031)
Troponin I: <0.03 ng/mL (ref <0.031)
Troponin I: <0.03 ng/mL (ref <0.031)

## 2015-01-09 LAB — CBG MONITORING, ED: GLUCOSE-CAPILLARY: 360 mg/dL — AB (ref 70–99)

## 2015-01-09 LAB — MAGNESIUM: MAGNESIUM: 2.2 mg/dL (ref 1.5–2.5)

## 2015-01-09 LAB — PHOSPHORUS: Phosphorus: 1.1 mg/dL — ABNORMAL LOW (ref 2.3–4.6)

## 2015-01-09 LAB — I-STAT TROPONIN, ED: TROPONIN I, POC: 0 ng/mL (ref 0.00–0.08)

## 2015-01-09 MED ORDER — INSULIN REGULAR HUMAN 100 UNIT/ML IJ SOLN: INTRAMUSCULAR | Status: DC

## 2015-01-09 MED ORDER — LISINOPRIL 5 MG PO TABS
5.0000 mg | ORAL_TABLET | Freq: Every day | ORAL | Status: DC
  Administered 2015-01-09 – 2015-01-10 (×2): 5 mg via ORAL
  Filled 2015-01-09 (×2): qty 1

## 2015-01-09 MED ORDER — SODIUM CHLORIDE 0.9 % IV SOLN
INTRAVENOUS | Status: DC
  Administered 2015-01-09: 8.5 [IU]/h via INTRAVENOUS
  Administered 2015-01-09: 7.9 [IU]/h via INTRAVENOUS
  Administered 2015-01-09: 0.9 [IU]/h via INTRAVENOUS
  Filled 2015-01-09: qty 2.5

## 2015-01-09 MED ORDER — ENOXAPARIN SODIUM 40 MG/0.4ML ~~LOC~~ SOLN
40.0000 mg | SUBCUTANEOUS | Status: DC
  Administered 2015-01-09 – 2015-01-10 (×2): 40 mg via SUBCUTANEOUS
  Filled 2015-01-09 (×2): qty 0.4

## 2015-01-09 MED ORDER — INSULIN STARTER KIT- PEN NEEDLES (ENGLISH)
1.0000 | Freq: Once | Status: DC
  Filled 2015-01-09: qty 1

## 2015-01-09 MED ORDER — LIVING WELL WITH DIABETES BOOK
Freq: Once | Status: AC
  Administered 2015-01-09: 11:00:00
  Filled 2015-01-09: qty 1

## 2015-01-09 MED ORDER — INSULIN ASPART 100 UNIT/ML ~~LOC~~ SOLN
0.0000 [IU] | Freq: Three times a day (TID) | SUBCUTANEOUS | Status: DC
  Administered 2015-01-09: 3 [IU] via SUBCUTANEOUS
  Administered 2015-01-10 (×2): 11 [IU] via SUBCUTANEOUS

## 2015-01-09 MED ORDER — SODIUM CHLORIDE 0.9 % IV SOLN: INTRAVENOUS | Status: DC

## 2015-01-09 MED ORDER — POTASSIUM CL IN DEXTROSE 5% 20 MEQ/L IV SOLN
20.0000 meq | INTRAVENOUS | Status: DC
  Administered 2015-01-09: 20 meq via INTRAVENOUS
  Filled 2015-01-09 (×5): qty 1000

## 2015-01-09 MED ORDER — DEXTROSE 50 % IV SOLN: 25.0000 mL | INTRAVENOUS | Status: DC | PRN

## 2015-01-09 MED ORDER — INSULIN REGULAR BOLUS VIA INFUSION
0.0000 [IU] | Freq: Three times a day (TID) | INTRAVENOUS | Status: DC
  Filled 2015-01-09: qty 10

## 2015-01-09 MED ORDER — ENSURE COMPLETE PO LIQD: 237.0000 mL | Freq: Two times a day (BID) | ORAL | Status: DC

## 2015-01-09 MED ORDER — INSULIN GLARGINE 100 UNIT/ML ~~LOC~~ SOLN
15.0000 [IU] | Freq: Every day | SUBCUTANEOUS | Status: DC
  Administered 2015-01-09: 15 [IU] via SUBCUTANEOUS
  Filled 2015-01-09 (×2): qty 0.15

## 2015-01-09 MED ORDER — SODIUM CHLORIDE 0.9 % IV SOLN
INTRAVENOUS | Status: DC
  Administered 2015-01-09 (×2): via INTRAVENOUS

## 2015-01-09 MED ORDER — INSULIN ASPART 100 UNIT/ML ~~LOC~~ SOLN
0.0000 [IU] | Freq: Every day | SUBCUTANEOUS | Status: DC
  Administered 2015-01-09: 5 [IU] via SUBCUTANEOUS

## 2015-01-09 NOTE — Progress Notes (Addendum)
Inpatient Diabetes Program Recommendations  AACE/ADA: New Consensus Statement on Inpatient Glycemic Control (2013)  Target Ranges:  Prepandial:   less than 140 mg/dL      Peak postprandial:   less than 180 mg/dL (1-2 hours)      Critically ill patients:  140 - 180 mg/dL   Results for Alexandria Noble, Alexandria Noble (MRN [id number]) as of 01/09/2015 07:42  Ref. Range 01/08/2015 18:24 01/08/2015 22:10 01/08/2015 23:07 01/09/2015 00:05 01/09/2015 01:11 01/09/2015 02:14 01/09/2015 03:23  Glucose-Capillary Latest Range: 70-99 mg/dL >600 (HH) 568 (HH) 499 (H) 360 (H) 324 (H) 287 (H) 250 (H)   Diabetes history: No Outpatient Diabetes medications: NA Current orders for Inpatient glycemic control: IV insulin drip per protocol  Inpatient Diabetes Program Recommendations Insulin - Basal: At time of transition off IV insulin drip, please consider ordering Lantus 15 units Q24H. Correction (SSI): At time of transition off IV insulin drip, please consider ordering Novolog sensitive correction scale. HgbA1C: A1C is in process.  Note: Patient has not prior documented history of diabetes. Initial lab glucose was 899 mg/dl on 01/08/15, CO2 was 15 and AG was 23 on labs at 18:48 on 2/19. According to labs at 4:00 am,  CO2 is 23 and AG is 8. In reviewing GlucoStabilizer flow sheet in the chart, last 4 CBGs 186/159/118/78 (last glucose value was at 7:18 am). Noted there is not any dextrose in IV fluids.  If IV insulin is continued, patient will need to have dextrose added to IV fluids and/or encouraged to eat so the body is getting glucose which will prevent the ketosis process from reoccurring. At time of transition off IV insulin, please consider ordering Lantus 15 units Q24H and Novolog sensitive correction scale. Diabetes Coordinator is not onsite over the weekend and is only on call as needed from 8:00-5:00. Have ordered Living Well with Diabetes booklet, patient education videos on diabetes, RD consult for diet education, and insulin  starter kit. Have also placed nursing order to remind bedside nursing to provide diabetes education, have patient review book and videos, and to educate on and allow patient to check own glucose and administer own insulin injections. Will continue to follow.  01/09/15@9 :01-Noted CBG up to 231 mg/dl at 8:20. Called unit and spoke with Gregary Signs, RN caring for patient. Inquired about whether patient had eaten breakfast. Gregary Signs, RN reports that patient had already started eating breakfast when the 231 mg/dl glucose was obtained and no extra insulin was given for carbohydrate coverage. Discussed Novolog 0-10 TID with meal order (RN to put number of carbs consumed in Emergency planning/management officer how much of a bolus of insulin to give for carb coverage). Also informed Gregary Signs, RN that Diabetes Coordinator had left recommendations for transition from IV to SQ insulin. Reminded Gregary Signs, RN that bedside nursing will need to educate patient on diabetes, how to check glucose, and how to inject insulin.  Thanks, Barnie Alderman, RN, MSN, CCRN, CDE Diabetes Coordinator Inpatient Diabetes Program [phone] (Team Pager) [phone] ([facility] office) [phone] [facility] office)

## 2015-01-09 NOTE — Progress Notes (Signed)
[facility] PROGRESS NOTE  Alexandria Noble JQB:[id number] DOB: [birth year] DOA: 01/08/2015 PCP: Scarlette Calico, MD  Assessment/Plan: 1. DKA with new onset DM, likely type 2 1. Anti-GAD pending 2. Pt now being transitioned off insulin gtt 3. Diabetic coordinator consulted, appreciate recs  2. Hypokalemia 1. Potassium added to IVF 3. Dehydration 1. On IVF as tolerated 2. Stable 4. HTN 1. BP stable 2. Will add low dose ACEI for renal protection given newly diagnosed DM 5. DVT prophylaxis 1. Lovenox subQ  Code Status: Full Family Communication: Pt in room, husband at bedside (indicate person spoken with, relationship, and if by phone, the number) Disposition Plan: pending   Consultants:  Diabetic coordinator  Procedures:    Antibiotics:  none (indicate start date, and stop date if known)  HPI/Subjective: No complaints. No acute events noted overnight  Objective: Filed Vitals:   01/09/15 0105 01/09/15 0527 01/09/15 1009 01/09/15 1407  BP: 124/82 103/71 134/81 121/85  Pulse: 95 78 93 81  Temp: 97.6 F (36.4 C) 97.8 F (36.6 C) 98.1 F (36.7 C) 97.4 F (36.3 C)  TempSrc: Oral Oral Oral Oral  Resp: _0 Height: _1  (1.575 m)     Weight: 64.139 kg (141 lb 6.4 oz)     SpO2: 99% 97% 99% 100%    Intake/Output Summary (Last 24 hours) at 01/09/15 1434 Last data filed at 01/09/15 1310  Gross per 24 hour  Intake  772.5 ml  Output    200 ml  Net  572.5 ml   Filed Weights   01/09/15 0105  Weight: 64.139 kg (141 lb 6.4 oz)    Exam:   General:  Awake, in nad  Cardiovascular: regular, s1, s2  Respiratory: normal resp effort, no wheezing  Abdomen: soft,nondistended  Musculoskeletal: perfused, no clubbing   Data Reviewed: Basic Metabolic Panel:  Recent Labs Lab 01/08/15 1848 01/09/15 0033 01/09/15 0356 01/09/15 0400 01/09/15 0800 01/09/15 1248  NA 136 148*  --  142 143 144  K 4.7 3.2*  --  3.0* 3.7 3.8  CL 98 115*  --  111 111 112   CO2 15* 19  --  _2 GLUCOSE 899* 389*  --  233* 174* 102*  BUN 21 19  --  _3 CREATININE 1.22* 1.09  --  0.79 0.78 0.69  CALCIUM 10.7* 9.8  --  9.7 9.3 9.2  MG  --   --  2.2  --   --   --   PHOS  --   --  1.1*  --   --   --    Liver Function Tests:  Recent Labs Lab 01/08/15 1848  AST 28  ALT 33  ALKPHOS 149*  BILITOT 1.8*  PROT 9.2*  ALBUMIN 4.9   No results for input(s): LIPASE, AMYLASE in the last 168 hours. No results for input(s): AMMONIA in the last 168 hours. CBC:  Recent Labs Lab 01/08/15 1848  WBC 9.1  HGB 15.4*  HCT 47.3*  MCV 82.1  PLT 282   Cardiac Enzymes:  Recent Labs Lab 01/09/15 0104 01/09/15 0800 01/09/15 1248  TROPONINI <0.03 <0.03 <0.03   BNP (last 3 results) No results for input(s): BNP in the last 8760 hours.  ProBNP (last 3 results) No results for input(s): PROBNP in the last 8760 hours.  CBG:  Recent Labs Lab 01/09/15 0927 01/09/15 1036 01/09/15 1141 01/09/15 1248 01/09/15 1402  GLUCAP 200* 137* 105* 91 118*  No results found for this or any previous visit (from the past 240 hour(s)).   Studies: No results found.  Scheduled Meds: . enoxaparin (LOVENOX) injection  40 mg Subcutaneous Q24H  . feeding supplement (ENSURE COMPLETE)  237 mL Oral BID BM  . insulin aspart  0-15 Units Subcutaneous TID WC  . insulin aspart  0-5 Units Subcutaneous QHS  . insulin glargine  15 Units Subcutaneous Daily  . insulin regular  0-10 Units Intravenous TID WC  . insulin starter kit- pen needles  1 kit Other Once   Continuous Infusions: . dextrose 5 % with KCl 20 mEq / L 20 mEq (01/09/15 1002)  . insulin (NOVOLIN-R) infusion 1.2 Units/hr (01/09/15 1408)    Active Problems:   DM (diabetes mellitus) type 2, uncontrolled, with ketoacidosis   Abnormal ECG   Dehydration   Diabetic ketoacidosis   DKA, type 2, not at goal   Andoni Busch, [facility] Pager [phone]. If 7PM-7AM, please contact night-coverage at  [email or web address], password [facility] 01/09/2015, 2:34 PM  LOS: 1 day

## 2015-01-09 NOTE — ED Notes (Signed)
Admitting MD at bedside.

## 2015-01-10 LAB — TROPONIN I
Troponin I: <0.03 ng/mL (ref <0.031)
Troponin I: <0.03 ng/mL (ref <0.031)
Troponin I: <0.03 ng/mL (ref <0.031)

## 2015-01-10 LAB — BASIC METABOLIC PANEL
Anion gap: 8 (ref 5–15)
BUN: 20 mg/dL (ref 6–23)
CO2: 21 mmol/L (ref 19–32)
CREATININE: 0.9 mg/dL (ref 0.50–1.10)
Calcium: 8.6 mg/dL (ref 8.4–10.5)
Chloride: 106 mmol/L (ref 96–112)
GFR calc non Af Amer: 74 mL/min — ABNORMAL LOW (ref >90)
GFR, EST AFRICAN AMERICAN: 86 mL/min — AB (ref >90)
GLUCOSE: 479 mg/dL — AB (ref 70–99)
POTASSIUM: 3.6 mmol/L (ref 3.5–5.1)
SODIUM: 135 mmol/L (ref 135–145)

## 2015-01-10 LAB — GLUCOSE, CAPILLARY
GLUCOSE-CAPILLARY: 318 mg/dL — AB (ref 70–99)
Glucose-Capillary: 340 mg/dL — ABNORMAL HIGH (ref 70–99)
Glucose-Capillary: 248 mg/dL — ABNORMAL HIGH (ref 70–99)

## 2015-01-10 MED ORDER — INSULIN GLARGINE 100 UNIT/ML ~~LOC~~ SOLN
25.0000 [IU] | Freq: Every day | SUBCUTANEOUS | Status: DC
  Administered 2015-01-10: 25 [IU] via SUBCUTANEOUS
  Filled 2015-01-10: qty 0.25

## 2015-01-10 MED ORDER — INSULIN ASPART 100 UNIT/ML FLEXPEN: 5.0000 [IU] | PEN_INJECTOR | Freq: Three times a day (TID) | SUBCUTANEOUS | Status: DC

## 2015-01-10 MED ORDER — FREESTYLE LANCETS MISC: Status: AC

## 2015-01-10 MED ORDER — INSULIN PEN NEEDLE 32G X 4 MM MISC
1.0000 | Freq: Four times a day (QID) | Status: AC
Start: 2015-01-10 — End: ?

## 2015-01-10 MED ORDER — INSULIN ASPART 100 UNIT/ML ~~LOC~~ SOLN
5.0000 [IU] | Freq: Three times a day (TID) | SUBCUTANEOUS | Status: DC
  Administered 2015-01-10: 5 [IU] via SUBCUTANEOUS

## 2015-01-10 MED ORDER — LISINOPRIL 5 MG PO TABS: 5.0000 mg | ORAL_TABLET | Freq: Every day | ORAL | Status: AC

## 2015-01-10 MED ORDER — INSULIN GLARGINE 100 UNITS/ML SOLOSTAR PEN: 25.0000 [IU] | PEN_INJECTOR | Freq: Every day | SUBCUTANEOUS | Status: DC

## 2015-01-10 MED ORDER — BLOOD GLUCOSE MONITOR KIT: PACK | Status: AC

## 2015-01-10 MED ORDER — INSULIN GLARGINE 100 UNIT/ML ~~LOC~~ SOLN: 25.0000 [IU] | Freq: Every day | SUBCUTANEOUS | Status: DC

## 2015-01-10 NOTE — Progress Notes (Signed)
CARE MANAGEMENT NOTE 01/10/2015  Patient:  Busch,Saprina   Account Number:  [id number]  Date Initiated:  01/10/2015  Documentation initiated by:  [facility]Edra Riccardi  Subjective/Objective Assessment:   DM     Action/Plan:   home with husband   Anticipated DC Date:  01/10/2015   Anticipated DC Plan:  HOME/SELF CARE      DC Planning Services  CM consult  Medication Assistance      Choice offered to / List presented to:             Status of service:  Completed, signed off Medicare Important Message given?   (If response is "NO", the following Medicare IM given date fields will be blank) Date Medicare IM given:   Medicare IM given by:   Date Additional Medicare IM given:   Additional Medicare IM given by:    Discharge Disposition:  HOME/SELF CARE  Per UR Regulation:    If discussed at Long Length of Stay Meetings, dates discussed:    Comments:  01/10/2015 1645 NCM spoke to pt and provided pt with Lantus and Novolog savings card for medications, pay may only pay $25 per month for medication. Faxed Rx to [facility]. [facility] closes at 6 pm. Isidoro DonningAlesia Metztli Sachdev RN CCM Case Mgmt phone [phone]

## 2015-01-10 NOTE — Progress Notes (Signed)
Pt watched educational videos on diabetes.

## 2015-01-10 NOTE — Progress Notes (Addendum)
Spoke with patient over the phone about new diabetes diagnosis. 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 Explained what an A1C is, basic pathophysiology of DM Type 2, basic home care, importance of checking CBGs and maintaining good CBG control to prevent long-term and short-term complications. Reviewed Living Well with Diabetes booklet in detail with patient. Discussed impact of nutrition, exercise, stress, sickness, and medications on diabetes control. Discussed carbohydrates, carbohydrate goals per day and meal, along with portion sizes.   Reviewed signs and symptoms of hyperglycemia and hypoglycemia along with treatment for both. Discussed glucose and A1C goals for diabetes control. Educated on Lantus and Novolog and discussed onset and duration of both insulins. Reviewed how to monitor glucose and asked that patient check her glucose 4 times a day (before meals and at bedtime) and any other time she feels her glucose is high or low. 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 Discussed insulin injections with vial/syringe versus insulin pen and patient feels that insulin pens would be more convenient for her. Reviewed insulin starter kit with patient. Educated patient over the phone on insulin pen use at home. Reviewed contents of insulin flexpen starter kit. Reviewed all steps of insulin pen including attachment of needle, 2-unit air shot, dialing up dose, giving injection, removing needle, disposal of sharps, storage of unused insulin, disposal of insulin etc. Patient reports that she understands how to use an insulin pen. Since an insulin pen is not available for the patient to practice with have asked patient to have the pharmacist at her pharmacy to show her how to use the insulin pen when she gets the  prescription filled. Asked patient to call her PCP in the morning and make an appointment to be seen by Wednesday and to be sure she takes her glucose log book with her to the visit so the doctor can review glucose trends to make any additional medication adjustments.  Patient states that she has also watched the patient education video on how to use an insulin pen. Patient verbalized understanding of information and she states that she does not have any further questions related to diabetes at this time.  MD to give patient Rxs for glucometer, testing supplies, insulin pens (Lantus and Novolog) and insulin pen needles.  Thanks, Barnie Alderman, RN, MSN, CCRN, CDE Diabetes Coordinator Inpatient Diabetes Program [phone] (Team Pager) [phone] ([facility] office) [phone] [facility] office)

## 2015-01-10 NOTE — Progress Notes (Signed)
INITIAL NUTRITION ASSESSMENT  DOCUMENTATION CODES Per approved criteria  -Not Applicable   INTERVENTION: Continue Ensure Complete po BID, each supplement provides 350 kcal and 13 grams of protein Encourage PO intake Provide diet education, see edu note  NUTRITION DIAGNOSIS: Unintentional weight loss related to decreased appetite as evidenced by 12% weight loss x 9 months.   Goal: Pt to meet >/= 90% of their estimated nutrition needs   Monitor:  PO and supplemental intake, weight, labs, I/O's  Reason for Assessment: Pt identified as at nutrition risk on the Malnutrition Screen Tool. RD consulted for diet education.  Admitting Dx: hyperglycemia  ASSESSMENT: 49 y.o. female reports hx of 2 weeks of dry mouth, increased urination and increased thirst, sluggish, blurred vision, decreased energy. She has lost 15 lb. She presented to her PCP for the first time yesterday 2/18 and had some blood work done showing elevated BG up to 600.  Pt reports eating less PTA, pt states that food hasn't tasted the same. PO intake: 100%  Per weight history documentation, pt has lost 20 lb since May 2015 (12% weight loss x 9 months, insignificant for time frame).  Pt has been ordered Ensure supplements but is not drinking them.  Nutrition focused physical exam shows no sign of depletion of muscle mass or body fat.  Labs reviewed: CBGs:169-379  Height: Ht Readings from Last 1 Encounters:  01/09/15 5' 2"  (1.575 m)    Weight: Wt Readings from Last 1 Encounters:  01/09/15 141 lb 6.4 oz (64.139 kg)    Ideal Body Weight: 110 lb  % Ideal Body Weight: 128%  Wt Readings from Last 10 Encounters:  01/09/15 141 lb 6.4 oz (64.139 kg)  03/26/14 161 lb (73.029 kg)  07/12/12 161 lb (73.029 kg)  06/05/12 159 lb (72.122 kg)    Usual Body Weight: 159 lb -per pt  % Usual Body Weight: 89%  BMI:  Body mass index is 25.86 kg/(m^2).  Estimated Nutritional Needs: Kcal: 1600-1800 Protein:  75-85g Fluid: 1.6L/day  Skin: intact  Diet Order: Diet Carb Modified  EDUCATION NEEDS: -Education needs addressed   Intake/Output Summary (Last 24 hours) at 01/10/15 1039 Last data filed at 01/10/15 0905  Gross per 24 hour  Intake 2716.67 ml  Output   1550 ml  Net 1166.67 ml    Last BM: 2/20  Labs:   Recent Labs Lab 01/09/15 0356  01/09/15 1248 01/09/15 1645 01/10/15 0116  NA  --   < > 144 140 135  K  --   < > 3.8 4.0 3.6  CL  --   < > 112 110 106  CO2  --   < > 26 24 21   BUN  --   < > 22 21 20   CREATININE  --   < > 0.69 0.69 0.90  CALCIUM  --   < > 9.2 9.0 8.6  MG 2.2  --   --   --   --   PHOS 1.1*  --   --   --   --   GLUCOSE  --   < > 102* 180* 479*  < > = values in this interval not displayed.  CBG (last 3)   Recent Labs  01/09/15 1645 01/09/15 2135 01/10/15 0807  GLUCAP 169* 379* 318*    Scheduled Meds: . enoxaparin (LOVENOX) injection  40 mg Subcutaneous Q24H  . feeding supplement (ENSURE COMPLETE)  237 mL Oral BID BM  . insulin aspart  0-15 Units Subcutaneous TID WC  .  insulin aspart  0-5 Units Subcutaneous QHS  . insulin glargine  15 Units Subcutaneous Daily  . insulin starter kit- pen needles  1 kit Other Once  . lisinopril  5 mg Oral Daily    Continuous Infusions:   Past Medical History  Diagnosis Date  . Hypertension     Past Surgical History  Procedure Laterality Date  . Tubal ligation  1997  . Breast surgery      Clayton Bibles, MS, RD, LDN Pager: [phone] After Hours Pager: [phone]

## 2015-01-10 NOTE — Plan of Care (Signed)
Problem: Unintentional Weight Loss (Brenda-3.2) Goal: Nutrition education Formal process to instruct or train a patient/client in a skill or to impart knowledge to help patients/clients voluntarily manage or modify food choices and eating behavior to maintain or improve health. Outcome: Completed/Met Date Met:  01/10/15  RD consulted for nutrition education regarding diabetes.   No results found for: HGBA1C  RD provided "Carbohydrate Counting for People with Diabetes" handout from the Academy of [employer] and Dietetics and "Plate Method" visual handout. Discussed different food groups and their effects on blood sugar, emphasizing carbohydrate-containing foods. Provided list of carbohydrates and recommended serving sizes of common foods.  Discussed importance of controlled and consistent carbohydrate intake throughout the day. Provided examples of ways to balance meals/snacks and encouraged intake of high-fiber, whole grain complex carbohydrates. Teach back method used.  Expect good compliance. Pt with strong family involvement.  Body mass index is 25.86 kg/(m^2). Pt meets criteria for overweight based on current BMI.  Current diet order is CHO modified, patient is consuming approximately 100% of meals at this time. Labs and medications reviewed. No further nutrition interventions warranted at this time. RD contact information provided. If additional nutrition issues arise, please re-consult RD.  Clayton Bibles, MS, RD, LDN Pager: [phone] After Hours Pager: [phone]

## 2015-01-10 NOTE — Discharge Summary (Signed)
Physician Discharge Summary  Alexandria Noble ZOX:[id number] DOB: 18-Feb-1966 DOA: 01/08/2015  PCP: Alexandria Lingerhomas Jones, MD  Admit date: 01/08/2015 Discharge date: 01/10/2015  Time spent: 25 minutes  Recommendations for Outpatient Follow-up:  1. Follow up with PCP in 1-2 weeks 2. Please follow up on anti-GAD ab and a1c, these were obtained but values still pending  Discharge Diagnoses:  Active Problems:   DM (diabetes mellitus) type 2, uncontrolled, with ketoacidosis   Abnormal ECG   Dehydration   Diabetic ketoacidosis   DKA, type 2, not at goal   Discharge Condition: Improved  Diet recommendation: diabetic, heart healthy  Filed Weights   01/09/15 0105  Weight: 64.139 kg (141 lb 6.4 oz)    History of present illness:  Please review h and p from 2/19 for details. Briefly, pt presents from PCP with markedly elevated blood glucose, found to be in DKA. The patient was admitted for further work up.  Hospital Course:  1. DKA with new onset DM, likely type 2 1. Anti-GAD and a1c remain pending 2. Pt was successfully transitioned off insulin gtt 3. Diabetic coordinator consulted, appreciate recs 4. Pt currently on 25 units lantus and 5 units aspart premeal. 5. Have advised patient to follow up closely with PCP for continued dose titration 2. Hypokalemia 1. Resolved 3. Dehydration 1. On IVF as tolerated 2. Stable 4. HTN 1. BP stable 2. Have added low dose ACEI for renal protection given newly diagnosed DM 5. DVT prophylaxis 1. Lovenox subQ while inpatient  Consultations:  Diabetic coordinator  Discharge Exam: Filed Vitals:   01/09/15 2258 01/10/15 0534 01/10/15 1210 01/10/15 1424  BP: 108/80 92/60 106/67 98/59  Pulse:  68 79 94  Temp:  97.7 F (36.5 C)  98 F (36.7 C)  TempSrc:  Oral  Oral  Resp:  18  18  Height:      Weight:      SpO2:  100%  100%    General: awake, in nad Cardiovascular: regular, s1, s2 Respiratory: normal resp effort, no  wheezing  Discharge Instructions      Discharge Instructions    Ambulatory referral to Nutrition and Diabetic Education    Complete by:  As directed   New DM dx; new to insulin (Lantus and Novolog; will be using insulin pens)     For home use only DME Glucometer    Complete by:  As directed             Medication List    TAKE these medications        freestyle lancets  Use as instructed     insulin aspart 100 UNIT/ML FlexPen  Commonly known as:  NOVOLOG  Inject 5 Units into the skin 3 (three) times daily with meals.     insulin glargine 100 unit/mL Sopn  Commonly known as:  LANTUS  Inject 0.25 mLs (25 Units total) into the skin at bedtime.     Insulin Pen Needle 32G X 4 MM Misc  Commonly known as:  BD PEN NEEDLE NANO U/F  1 Device by Does not apply route 4 (four) times daily.     lisinopril 5 MG tablet  Commonly known as:  PRINIVIL,ZESTRIL  Take 1 tablet (5 mg total) by mouth daily.     PARoxetine Mesylate 7.5 MG Caps  Take 1 capsule by mouth at bedtime.       No Known Allergies Follow-up Information    Follow up with Alexandria Lingerhomas Jones, MD. Schedule an appointment as soon as  possible for a visit in 1 week.   Specialty:  Internal Medicine   Contact information:   [address] [place] [address] [phone]        The results of significant diagnostics from this hospitalization (including imaging, microbiology, ancillary and laboratory) are listed below for reference.    Significant Diagnostic Studies: No results found.  Microbiology: No results found for this or any previous visit (from the past 240 hour(s)).   Labs: Basic Metabolic Panel:  Recent Labs Lab 01/09/15 0356 01/09/15 0400 01/09/15 0800 01/09/15 1248 01/09/15 1645 01/10/15 0116  NA  --  142 143 144 140 135  K  --  3.0* 3.7 3.8 4.0 3.6  CL  --  111 111 112 110 106  CO2  --  GLUCOSE  --  233* 174* 102* 180* 479*  BUN  --  CREATININE   --  0.79 0.78 0.69 0.69 0.90  CALCIUM  --  9.7 9.3 9.2 9.0 8.6  MG 2.2  --   --   --   --   --   PHOS 1.1*  --   --   --   --   --    Liver Function Tests:  Recent Labs Lab 01/08/15 1848  AST 28  ALT 33  ALKPHOS 149*  BILITOT 1.8*  PROT 9.2*  ALBUMIN 4.9   No results for input(s): LIPASE, AMYLASE in the last 168 hours. No results for input(s): AMMONIA in the last 168 hours. CBC:  Recent Labs Lab 01/08/15 1848  WBC 9.1  HGB 15.4*  HCT 47.3*  MCV 82.1  PLT 282   Cardiac Enzymes:  Recent Labs Lab 01/09/15 1248 01/09/15 1926 01/10/15 0116 01/10/15 0650 01/10/15 1307  TROPONINI <0.03 <0.03 <0.03 <0.03 <0.03   BNP: BNP (last 3 results) No results for input(s): BNP in the last 8760 hours.  ProBNP (last 3 results) No results for input(s): PROBNP in the last 8760 hours.  CBG:  Recent Labs Lab 01/09/15 1645 01/09/15 2135 01/10/15 0807 01/10/15 1157 01/10/15 1501  GLUCAP 169* 379* 318* 340* 248*       Signed:  [name]  [facility] 01/10/2015, 3:29 PM

## 2015-01-10 NOTE — Discharge Instructions (Signed)
Diabetes Mellitus and Food °It is important for you to manage your blood sugar (glucose) level. Your blood glucose level can be greatly affected by what you eat. Eating healthier foods in the appropriate amounts throughout the day at about the same time each day will help you control your blood glucose level. It can also help slow or prevent worsening of your diabetes mellitus. Healthy eating may even help you improve the level of your blood pressure and reach or maintain a healthy weight.  °HOW CAN FOOD AFFECT ME? °Carbohydrates °Carbohydrates affect your blood glucose level more than any other type of food. Your dietitian will help you determine how many carbohydrates to eat at each meal and teach you how to count carbohydrates. Counting carbohydrates is important to keep your blood glucose at a healthy level, especially if you are using insulin or taking certain medicines for diabetes mellitus. °Alcohol °Alcohol can cause sudden decreases in blood glucose (hypoglycemia), especially if you use insulin or take certain medicines for diabetes mellitus. Hypoglycemia can be a life-threatening condition. Symptoms of hypoglycemia (sleepiness, dizziness, and disorientation) are similar to symptoms of having too much alcohol.  °If your health care provider has given you approval to drink alcohol, do so in moderation and use the following guidelines: °· Women should not have more than one drink per day, and men should not have more than two drinks per day. One drink is equal to: °¨ 12 oz of beer. °¨ 5 oz of wine. °¨ 1½ oz of hard liquor. °· Do not drink on an empty stomach. °· Keep yourself hydrated. Have water, diet soda, or unsweetened iced tea. °· Regular soda, juice, and other mixers might contain a lot of carbohydrates and should be counted. °WHAT FOODS ARE NOT RECOMMENDED? °As you make food choices, it is important to remember that all foods are not the same. Some foods have fewer nutrients per serving than other  foods, even though they might have the same number of calories or carbohydrates. It is difficult to get your body what it needs when you eat foods with fewer nutrients. Examples of foods that you should avoid that are high in calories and carbohydrates but low in nutrients include: °· Trans fats (most processed foods list trans fats on the Nutrition Facts label). °· Regular soda. °· Juice. °· Candy. °· Sweets, such as cake, pie, doughnuts, and cookies. °· Fried foods. °WHAT FOODS CAN I EAT? °Have nutrient-rich foods, which will nourish your body and keep you healthy. The food you should eat also will depend on several factors, including: °· The calories you need. °· The medicines you take. °· Your weight. °· Your blood glucose level. °· Your blood pressure level. °· Your cholesterol level. °You also should eat a variety of foods, including: °· Protein, such as meat, poultry, fish, tofu, nuts, and seeds (lean animal proteins are best). °· Fruits. °· Vegetables. °· Dairy products, such as milk, cheese, and yogurt (low fat is best). °· Breads, grains, pasta, cereal, rice, and beans. °· Fats such as olive oil, trans fat-free margarine, canola oil, avocado, and olives. °DOES EVERYONE WITH DIABETES MELLITUS HAVE THE SAME MEAL PLAN? °Because every person with diabetes mellitus is different, there is not one meal plan that works for everyone. It is very important that you meet with a dietitian who will help you create a meal plan that is just right for you. °Document Released: 08/03/2005 Document Revised: 11/11/2013 Document Reviewed: 10/03/2013 °Exit[employer]® Patient Information ©2015 [employer]. This   information is not intended to replace advice given to you by your health care provider. Make sure you discuss any questions you have with your health care provider.   Daily Diabetes Record Check your blood glucose (BG) as directed by your health care provider. Use this form to record your results as well as any diabetes  medicines you take, including insulin. Checking your BG, recording it, and bringing your records to your health care provider is very helpful in managing your diabetes. These numbers help your health care provider know if any changes are needed to your diabetes plan.  Week of _____________________________ Date: _________  Alexandria Noble, BG/Medicines: ________________ / __________________________________________________________  LUNCH, BG/Medicines: ____________________ / __________________________________________________________  Alexandria Noble, BG/Medicines: ___________________ / __________________________________________________________  BEDTIME, BG/Medicines: __________________ / __________________________________________________________ Date: _________  Alexandria Noble, BG/Medicines: ________________ / __________________________________________________________  LUNCH, BG/Medicines: ____________________ / __________________________________________________________  Alexandria Noble, BG/Medicines: ___________________ / __________________________________________________________  BEDTIME, BG/Medicines: __________________ / __________________________________________________________ Date: _________  Alexandria Noble, BG/Medicines: ________________ / __________________________________________________________  LUNCH, BG/Medicines: ____________________ / __________________________________________________________  Alexandria Noble, BG/Medicines: ___________________ / __________________________________________________________  BEDTIME, BG/Medicines: __________________ / __________________________________________________________ Date: _________  Alexandria Noble, BG/Medicines: ________________ / __________________________________________________________  LUNCH, BG/Medicines: ____________________ / __________________________________________________________  Alexandria Noble, BG/Medicines: ___________________ /  __________________________________________________________  BEDTIME, BG/Medicines: __________________ / __________________________________________________________ Date: _________  Alexandria Noble, BG/Medicines: ________________ / __________________________________________________________  LUNCH, BG/Medicines: ____________________ / __________________________________________________________  Alexandria Noble, BG/Medicines: ___________________ / __________________________________________________________  BEDTIME, BG/Medicines: __________________ / __________________________________________________________ Date: _________  Alexandria Noble, BG/Medicines: ________________ / __________________________________________________________  LUNCH, BG/Medicines: ____________________ / __________________________________________________________  Alexandria Noble, BG/Medicines: ___________________ / __________________________________________________________  BEDTIME, BG/Medicines: __________________ / __________________________________________________________ Date: _________  Alexandria Noble, BG/Medicines: ________________ / __________________________________________________________  LUNCH, BG/Medicines: ____________________ / __________________________________________________________  Alexandria Noble, BG/Medicines: ___________________ / __________________________________________________________  BEDTIME, BG/Medicines: __________________ / __________________________________________________________ Notes: __________________________________________________________________________________________________ Document Released: 10/10/2004 Document Revised: 03/23/2014 Document Reviewed: 12/31/2013 [employer] Patient Information 2015 [place]. This information is not intended to replace advice given to you by your health care provider. Make sure you discuss any questions you have with your health care provider.   Diabetic Ketoacidosis Diabetic  ketoacidosis (DKA) is a life-threatening complication of type 1 diabetes. It must be quickly recognized and treated. Treatment requires hospitalization. CAUSES  When there is no insulin in the body, glucose (sugar) cannot be used, and the body breaks down fat for energy. When fat breaks down, acids (ketones) build up in the blood. Very high levels of glucose and high levels of acids lead to severe loss of body fluids (dehydration) and other dangerous chemical changes. This stresses your vital organs and can cause coma or death. SIGNS AND SYMPTOMS   Tiredness (fatigue).  Weight loss.  Excessive thirst.  Ketones in your urine.  Light-headedness.  Fruity or sweet smelling breath.  Excessive urination.  Visual changes.  Confusion or irritability.  Nausea or vomiting.  Rapid breathing.  Stomachache or abdominal pain. DIAGNOSIS  Your health care provider will diagnose DKA based on your history, physical exam, and blood tests. The health care provider will check to see if you have another illness that caused you to go into DKA. Most of this will be done quickly in an emergency room. TREATMENT   Fluid replacement to correct dehydration.  Insulin.  Correction of electrolytes, such as potassium and sodium.  Antibiotic medicines. PREVENTION  Always take your insulin. Do not skip your insulin injections.  If you are sick, treat yourself quickly. Your body often needs more insulin to fight the illness.  Check your blood glucose regularly.  Check urine ketones if your blood glucose is greater than 240 milligrams per deciliter (mg/dL).  Do not use outdated (expired) insulin.  If your blood glucose is high, drink plenty of fluids. This helps flush out ketones. HOME CARE INSTRUCTIONS   If you are sick, follow the advice of your health care provider.  To prevent dehydration, drink enough water and fluids to keep your urine clear or pale yellow.  If you cannot eat, alternate  between drinking fluids with sugar (soda, juices, flavored gelatin) and salty fluids (broth, bouillon).  If you can eat, follow your usual diet and drink sugar-free liquids (water, diet drinks).  Always take your usual dose of insulin. If you cannot eat or if your glucose is getting too low, call your health care provider for further instructions.  Continue to monitor your blood or urine ketones every 3-4 hours around the clock. Set your alarm clock or have someone wake you up. If you are too sick, have someone test it for you.  Rest and avoid exercise. SEEK MEDICAL CARE IF:   You have a fever.  You have ketones in your urine, or your blood glucose is higher than a level your health care provider suggests. You may need extra insulin. Call your health care provider if you need advice on adjusting your insulin.  You cannot drink at least a tablespoon (15 mL) of fluid every 15-20 minutes.  You have been vomiting for more than 2 hours.  You have symptoms of DKA:  Fruity smelling breath.  Breathing faster or slower.  Becoming very sleepy. SEEK IMMEDIATE MEDICAL CARE IF:   You have signs of dehydration:  Decreased urination.  Increased thirst.  Dry skin and mouth.  Light-headedness.  Your blood glucose is very high (as advised by your health care provider) twice in a row.  You faint.  You have chest pain or trouble breathing.  You have a sudden, severe headache.  You have sudden weakness in one arm or one leg.  You have sudden trouble speaking or swallowing.  You have vomiting or diarrhea that is getting worse after 3 hours.  You have abdominal pain. MAKE SURE YOU:   Understand these instructions.  Will watch your condition.  Will get help right away if you are not doing well or get worse. Document Released: 11/03/2000 Document Revised: 11/11/2013 Document Reviewed: 05/12/2009 [facility] Patient Information 2015 [place], [place]. This information is not intended to  replace advice given to you by your health care provider. Make sure you discuss any questions you have with your health care provider.   How to Avoid Diabetes Problems You can do a lot to prevent or slow down diabetes problems. Following your diabetes plan and taking care of yourself can reduce your risk of serious or life-threatening complications. Below, you will find certain things you can do to prevent diabetes problems. MANAGE YOUR DIABETES Follow your health care provider's, nurse educator's, and dietitian's instructions for managing your diabetes. They will teach you the basics of diabetes care. They can help answer questions you may have. Learn about diabetes and make healthy choices regarding eating and physical activity. Monitor your blood glucose level regularly. Your health care provider will help you decide how often to check your blood glucose level depending on your treatment goals and how well you are meeting them.  DO NOT USE NICOTINE Nicotine and diabetes are a dangerous combination. Nicotine raises your risk for diabetes problems. If you quit using nicotine, you will lower your risk for heart attack, stroke, nerve disease, and kidney disease. Your cholesterol and your blood pressure  levels may improve. Your blood circulation will also improve. Do not use any tobacco products, including cigarettes, chewing tobacco, or electronic cigarettes. If you need help quitting, ask your health care provider. KEEP YOUR BLOOD PRESSURE UNDER CONTROL Keeping your blood pressure under control will help prevent damage to your eyes, kidneys, heart, and blood vessels. Blood pressure consists of two numbers. The top number should be below 120, and the bottom number should be below 80 (120/80). Keep your blood pressure as close to these numbers as you can. If you already have kidney disease, you may want even lower blood pressure to protect your kidneys. Talk to your health care provider to make sure that  your blood pressure goal is right for your needs. Meal planning, medicines, and exercise can help you reach your blood pressure target. Have your blood pressure checked at every visit with your health care provider. KEEP YOUR CHOLESTEROL UNDER CONTROL Normal cholesterol levels will help prevent heart disease and stroke. These are the biggest health problems for people with diabetes. Keeping cholesterol levels under control can also help with blood flow. Have your cholesterol level checked at least once a year. Your health care provider may prescribe a medicine known as a statin. Statins lower your cholesterol. If you are not taking a statin, ask your health care provider if you should be. Meal planning, exercise, and medicines can help you reach your cholesterol targets.  SCHEDULE AND KEEP YOUR ANNUAL PHYSICAL EXAMS AND EYE EXAMS Your health care provider will tell you how often he or she wants to see you depending on your plan of treatment. It is important that you keep these appointments so that possible problems can be identified early and complications can be avoided or treated.  Every visit with your health care provider should include your weight, blood pressure, and an evaluation of your blood glucose control.  Your hemoglobin A1c should be checked:  At least twice a year if you are at your goal.  Every 3 months if there are changes in treatment.  If you are not meeting your goals.  Your blood lipids should be checked yearly. You should also be checked yearly to see if you have protein in your urine (microalbumin).  Schedule a dilated eye exam within 5 years of your diagnosis if you have type 1 diabetes, and then yearly. Schedule a dilated eye exam at diagnosis if you have type 2 diabetes, and then yearly. All exams thereafter can be extended to every 2 to 3 years if one or more exams have been normal. KEEP YOUR VACCINES CURRENT The flu vaccine is recommended yearly. The formula for the  vaccine changes every year and needs to be updated for the best protection against current viruses. It is recommended that people with diabetes who are over 49 years old get the pneumonia vaccine. In some cases, two separate shots may be given. Ask your health care provider if your pneumonia vaccination is up-to-date. However, there are some instances where another vaccine is recommended. Check with your health care provider. TAKE CARE OF YOUR FEET  Diabetes may cause you to have a poor blood supply (circulation) to your legs and feet. Because of this, the skin may be thinner, break easier, and heal more slowly. You also may have nerve damage in your legs and feet, causing decreased feeling. You may not notice minor injuries to your feet that could lead to serious problems or infections. Taking care of your feet is very important. Visual foot  exams are performed at every routine medical visit. The exams check for cuts, injuries, or other problems with the feet. A comprehensive foot exam should be done yearly. This includes visual inspection as well as assessing foot pulses and testing for loss of sensation. You should also do the following:  Inspect your feet daily for cuts, calluses, blisters, ingrown toenails, and signs of infection, such as redness, swelling, or pus.  Wash and dry your feet thoroughly, especially between the toes.  Avoid soaking your feet regularly in hot water baths.  Moisturize dry skin with lotion, avoiding areas between your toes.  Cut toenails straight across and file the edges.  Avoid shoes that do not fit well or have areas that irritate your skin.  Avoid going barefooted or wearing only socks. Your feet need protection. TAKE CARE OF YOUR TEETH People with poorly controlled diabetes are more likely to have gum (periodontal) disease. These infections make diabetes harder to control. Periodontal diseases, if left untreated, can lead to tooth loss. Brush your teeth twice a  day, floss, and see your dentist for checkups and cleaning every 6 months, or 2 times a year. ASK YOUR HEALTH CARE PROVIDER ABOUT TAKING ASPIRIN Taking aspirin daily is recommended to help prevent cardiovascular disease in people with and without diabetes. Ask your health care provider if this would benefit you and what dose he or she would recommend. DRINK RESPONSIBLY Moderate amounts of alcohol (less than 1 drink per day for adult women and less than 2 drinks per day for adult men) have a minimal effect on blood glucose if ingested with food. It is important to eat food with alcohol to avoid hypoglycemia. People should avoid alcohol if they have a history of alcohol abuse or dependence, if they are pregnant, and if they have liver disease, pancreatitis, advanced neuropathy, or severe hypertriglyceridemia. LESSEN STRESS Living with diabetes can be stressful. When you are under stress, your blood glucose may be affected in two ways:  Stress hormones may cause your blood glucose to rise.  You may be distracted from taking good care of yourself. It is a good idea to be aware of your stress level and make changes that are necessary to help you better manage challenging situations. Support groups, planned relaxation, a hobby you enjoy, meditation, healthy relationships, and exercise all work to lower your stress level. If your efforts do not seem to be helping, get help from your health care provider or a trained mental health professional. Document Released: 07/25/2011 Document Revised: 03/23/2014 Document Reviewed: 12/31/2013 [facility] Patient Information 2015 [place], [place]. This information is not intended to replace advice given to you by your health care provider. Make sure you discuss any questions you have with your health care provider.

## 2015-01-10 NOTE — Progress Notes (Signed)
Inpatient Diabetes Program Recommendations  AACE/ADA: New Consensus Statement on Inpatient Glycemic Control (2013)  Target Ranges:  Prepandial:   less than 140 mg/dL      Peak postprandial:   less than 180 mg/dL (1-2 hours)      Critically ill patients:  140 - 180 mg/dL   Results for Alexandria Noble, Alexandria Noble (MRN [id number]) as of 01/10/2015 09:32  Ref. Range 01/09/2015 12:48 01/09/2015 14:02 01/09/2015 16:45 01/09/2015 21:35 01/10/2015 08:07  Glucose-Capillary Latest Range: 70-99 mg/dL 91 284118 (H) 132169 (H) 440379 (H) 318 (H)   Current orders for Inpatient glycemic control: Lantus 15 units daily, Novolog 0-15 units TID with meals, Novolog 0-5 units HS  Inpatient Diabetes Program Recommendations Insulin - Basal: Please increase Lantus to 25 units daily to start this morning. Insulin - Meal Coverage: Please consider ordering Novolog 5 units TID with meals if patient eats at least 50% of meals.  Thanks, Orlando PennerMarie Temperence Zenor, RN, MSN, CCRN, CDE Diabetes Coordinator Inpatient Diabetes Program [phone] (Team Pager) [phone] ([facility] office) [phone] [facility] office)

## 2015-01-11 LAB — HEMOGLOBIN A1C
Hgb A1c MFr Bld: 14.9 % — ABNORMAL HIGH (ref 4.8–5.6)
MEAN PLASMA GLUCOSE: 381 mg/dL

## 2015-01-12 LAB — GLUTAMIC ACID DECARBOXYLASE AUTO ABS

## 2015-01-13 NOTE — Addendum Note (Signed)
Addended by: Etta GrandchildJONES, Curley Fayette L on: 01/13/2015 09:05 AM   Modules accepted: Kipp BroodSmartSet

## 2015-02-10 ENCOUNTER — Ambulatory Visit: Payer: 59 | Admitting: *Deleted

## 2015-04-09 ENCOUNTER — Other Ambulatory Visit: Payer: Self-pay

## 2015-04-09 DIAGNOSIS — Z1231 Encounter for screening mammogram for malignant neoplasm of breast: Secondary | ICD-10-CM

## 2015-04-27 ENCOUNTER — Ambulatory Visit
Admission: RE | Admit: 2015-04-27 | Discharge: 2015-04-27 | Disposition: A | Discharge status: Home / Self Care | Payer: 59 | Source: Ambulatory Visit

## 2015-04-27 DIAGNOSIS — Z1231 Encounter for screening mammogram for malignant neoplasm of breast: Secondary | ICD-10-CM

## 2016-03-20 ENCOUNTER — Other Ambulatory Visit: Payer: Self-pay

## 2016-03-20 DIAGNOSIS — Z1231 Encounter for screening mammogram for malignant neoplasm of breast: Secondary | ICD-10-CM

## 2016-05-04 ENCOUNTER — Ambulatory Visit
Admission: RE | Admit: 2016-05-04 | Discharge: 2016-05-04 | Disposition: A | Discharge status: Home / Self Care | Payer: Managed Care, Other (non HMO) | Source: Ambulatory Visit

## 2016-05-04 DIAGNOSIS — Z1231 Encounter for screening mammogram for malignant neoplasm of breast: Secondary | ICD-10-CM

## 2016-05-05 ENCOUNTER — Other Ambulatory Visit: Payer: Self-pay | Admitting: Family Medicine

## 2016-05-05 DIAGNOSIS — R928 Other abnormal and inconclusive findings on diagnostic imaging of breast: Secondary | ICD-10-CM

## 2016-05-15 ENCOUNTER — Ambulatory Visit
Admission: RE | Admit: 2016-05-15 | Discharge: 2016-05-15 | Disposition: A | Discharge status: Home / Self Care | Payer: Managed Care, Other (non HMO) | Source: Ambulatory Visit | Attending: Family Medicine | Admitting: Family Medicine

## 2016-05-15 DIAGNOSIS — R928 Other abnormal and inconclusive findings on diagnostic imaging of breast: Secondary | ICD-10-CM

## 2016-10-19 ENCOUNTER — Encounter: Payer: Self-pay | Admitting: Gastroenterology

## 2016-12-05 ENCOUNTER — Ambulatory Visit (AMBULATORY_SURGERY_CENTER): Payer: Self-pay

## 2016-12-05 VITALS — Ht 62.0 in | Wt 142.4 lb

## 2016-12-05 DIAGNOSIS — Z1211 Encounter for screening for malignant neoplasm of colon: Secondary | ICD-10-CM

## 2016-12-05 MED ORDER — NA SULFATE-K SULFATE-MG SULF 17.5-3.13-1.6 GM/177ML PO SOLN: ORAL | 0 refills | Status: DC

## 2016-12-05 NOTE — Progress Notes (Signed)
Per pt, no allergies to soy or egg products.Pt not taking any weight loss meds or using  O2 at home. 

## 2016-12-06 ENCOUNTER — Encounter: Payer: Self-pay | Admitting: Gastroenterology

## 2016-12-19 ENCOUNTER — Ambulatory Visit (AMBULATORY_SURGERY_CENTER): Payer: BLUE CROSS/BLUE SHIELD | Admitting: Gastroenterology

## 2016-12-19 ENCOUNTER — Encounter: Payer: Self-pay | Admitting: Gastroenterology

## 2016-12-19 VITALS — BP 132/95 | HR 72 | Temp 98.4°F | Resp 26 | Ht 62.0 in | Wt 142.0 lb

## 2016-12-19 DIAGNOSIS — Z1212 Encounter for screening for malignant neoplasm of rectum: Secondary | ICD-10-CM | POA: Diagnosis not present

## 2016-12-19 DIAGNOSIS — Z1211 Encounter for screening for malignant neoplasm of colon: Secondary | ICD-10-CM

## 2016-12-19 MED ORDER — SODIUM CHLORIDE 0.9 % IV SOLN: 500.0000 mL | INTRAVENOUS | Status: AC

## 2016-12-19 NOTE — Op Note (Signed)
[facility] Patient Name: Alexandria HawkingBonnie Noble Procedure Date: 12/19/2016 8:08 AM MRN: [id number] Endoscopist: Napoleon FormKavitha V. Gaylin Osoria , MD Age: 51 Referring MD:  Date of Birth: [birth year] Gender: Female Account #: [id number] Procedure:                Colonoscopy Indications:              Screening for colorectal malignant neoplasm, This                            is the patient's first colonoscopy Medicines:                Monitored Anesthesia Care Procedure:                Pre-Anesthesia Assessment:                           - Prior to the procedure, a History and Physical                            was performed, and patient medications and                            allergies were reviewed. The patient's tolerance of                            previous anesthesia was also reviewed. The risks                            and benefits of the procedure and the sedation                            options and risks were discussed with the patient.                            All questions were answered, and informed consent                            was obtained. Prior Anticoagulants: The patient has                            taken no previous anticoagulant or antiplatelet                            agents. ASA Grade Assessment: II - A patient with                            mild systemic disease. After reviewing the risks                            and benefits, the patient was deemed in                            satisfactory condition to undergo the procedure.  After obtaining informed consent, the colonoscope                            was passed under direct vision. Throughout the                            procedure, the patient's blood pressure, pulse, and                            oxygen saturations were monitored continuously. The                            Model CF-HQ190L [id number]) scope was introduced                            through the anus  and advanced to the the terminal                            ileum, with identification of the appendiceal                            orifice and IC valve. The colonoscopy was performed                            without difficulty. The patient tolerated the                            procedure well. The quality of the bowel                            preparation was excellent. The terminal ileum,                            ileocecal valve, appendiceal orifice, and rectum                            were photographed. Scope In: 8:11:49 AM Scope Out: 8:31:24 AM Scope Withdrawal Time: 0 hours 16 minutes 31 seconds  Total Procedure Duration: 0 hours 19 minutes 35 seconds  Findings:                 The perianal and digital rectal examinations were                            normal.                           Non-bleeding internal hemorrhoids were found during                            retroflexion. The hemorrhoids were small.                           The exam was otherwise without abnormality. Complications:            No immediate complications. Estimated Blood  Loss:     Estimated blood loss: none. Impression:               - Non-bleeding internal hemorrhoids.                           - The examination was otherwise normal.                           - No specimens collected. Recommendation:           - Patient has a contact number available for                            emergencies. The signs and symptoms of potential                            delayed complications were discussed with the                            patient. Return to normal activities tomorrow.                            Written discharge instructions were provided to the                            patient.                           - Resume previous diet.                           - Continue present medications.                           - Repeat colonoscopy in 10 years for screening                             purposes. Napoleon Form, MD 12/19/2016 8:38:29 AM This report has been signed electronically.

## 2016-12-19 NOTE — Patient Instructions (Signed)
YOU HAD AN ENDOSCOPIC PROCEDURE TODAY AT THE [facility]:   Refer to the procedure report that was given to you for any specific questions about what was found during the examination.  If the procedure report does not answer your questions, please call your gastroenterologist to clarify.  If you requested that your care partner not be given the details of your procedure findings, then the procedure report has been included in a sealed envelope for you to review at your convenience later.  YOU SHOULD EXPECT: Some feelings of bloating in the abdomen. Passage of more gas than usual.  Walking can help get rid of the air that was put into your GI tract during the procedure and reduce the bloating. If you had a lower endoscopy (such as a colonoscopy or flexible sigmoidoscopy) you may notice spotting of blood in your stool or on the toilet paper. If you underwent a bowel prep for your procedure, you may not have a normal bowel movement for a few days.  Please Note:  You might notice some irritation and congestion in your nose or some drainage.  This is from the oxygen used during your procedure.  There is no need for concern and it should clear up in a day or so.  SYMPTOMS TO REPORT IMMEDIATELY:   Following lower endoscopy (colonoscopy or flexible sigmoidoscopy):  Excessive amounts of blood in the stool  Significant tenderness or worsening of abdominal pains  Swelling of the abdomen that is new, acute  Fever of 100F or higher   For urgent or emergent issues, a gastroenterologist can be reached at any hour by calling [phone].   DIET:  We do recommend a small meal at first, but then you may proceed to your regular diet.  Drink plenty of fluids but you should avoid alcoholic beverages for 24 hours.  ACTIVITY:  You should plan to take it easy for the rest of today and you should NOT DRIVE or use heavy machinery until tomorrow (because of the sedation medicines used during the test).     FOLLOW UP: Our staff will call the number listed on your records the next business day following your procedure to check on you and address any questions or concerns that you may have regarding the information given to you following your procedure. If we do not reach you, we will leave a message.  However, if you are feeling well and you are not experiencing any problems, there is no need to return our call.  We will assume that you have returned to your regular daily activities without incident.  If any biopsies were taken you will be contacted by phone or by letter within the next 1-3 weeks.  Please call us at [phone] if you have not heard about the biopsies in 3 weeks.    SIGNATURES/CONFIDENTIALITY: You and/or your care partner have signed paperwork which will be entered into your electronic medical record.  These signatures attest to the fact that that the information above on your After Visit Summary has been reviewed and is understood.  Full responsibility of the confidentiality of this discharge information lies with you and/or your care-partner  Hemorrhoid information given.  Recall colonoscopy in 10 years-2028.  See Dr.Nandigam in office if constipation does not improve

## 2016-12-19 NOTE — Progress Notes (Signed)
Report given to PACU RN, vss 

## 2016-12-20 ENCOUNTER — Telehealth: Payer: Self-pay

## 2016-12-20 NOTE — Telephone Encounter (Signed)
Name identifier. Left message will call back later today. 

## 2016-12-20 NOTE — Telephone Encounter (Signed)
  Follow up Call-  Call back number 12/19/2016  Post procedure Call Back phone  # [phone]  Permission to leave phone message Yes  Some recent data might be hidden     Patient questions:  Do you have a fever, pain , or abdominal swelling? No. Pain Score  0 *  Have you tolerated food without any problems? Yes.    Have you been able to return to your normal activities? Yes.    Do you have any questions about your discharge instructions: Diet   No. Medications  No. Follow up visit  No.  Do you have questions or concerns about your Care? No.  Actions: * If pain score is 4 or above: No action needed, pain <4.

## 2017-04-10 ENCOUNTER — Other Ambulatory Visit: Payer: Self-pay | Admitting: Family Medicine

## 2017-04-10 DIAGNOSIS — Z1231 Encounter for screening mammogram for malignant neoplasm of breast: Secondary | ICD-10-CM

## 2017-05-08 ENCOUNTER — Ambulatory Visit
Admission: RE | Admit: 2017-05-08 | Discharge: 2017-05-08 | Disposition: A | Discharge status: Home / Self Care | Payer: BLUE CROSS/BLUE SHIELD | Source: Ambulatory Visit | Attending: Family Medicine | Admitting: Family Medicine

## 2017-05-08 DIAGNOSIS — Z1231 Encounter for screening mammogram for malignant neoplasm of breast: Secondary | ICD-10-CM

## 2018-06-20 ENCOUNTER — Other Ambulatory Visit: Payer: Self-pay | Admitting: Family Medicine

## 2018-06-20 DIAGNOSIS — Z1231 Encounter for screening mammogram for malignant neoplasm of breast: Secondary | ICD-10-CM

## 2018-07-17 ENCOUNTER — Ambulatory Visit
Admission: RE | Admit: 2018-07-17 | Discharge: 2018-07-17 | Disposition: A | Discharge status: Home / Self Care | Payer: BLUE CROSS/BLUE SHIELD | Source: Ambulatory Visit | Attending: Family Medicine | Admitting: Family Medicine

## 2018-07-17 DIAGNOSIS — Z1231 Encounter for screening mammogram for malignant neoplasm of breast: Secondary | ICD-10-CM

## 2019-08-08 ENCOUNTER — Other Ambulatory Visit: Payer: Self-pay | Admitting: Family Medicine

## 2019-08-08 DIAGNOSIS — Z1231 Encounter for screening mammogram for malignant neoplasm of breast: Secondary | ICD-10-CM

## 2019-09-22 ENCOUNTER — Other Ambulatory Visit: Payer: Self-pay

## 2019-09-22 ENCOUNTER — Ambulatory Visit
Admission: RE | Admit: 2019-09-22 | Discharge: 2019-09-22 | Disposition: A | Discharge status: Home / Self Care | Payer: BC Managed Care – PPO | Source: Ambulatory Visit | Attending: Family Medicine | Admitting: Family Medicine

## 2019-09-22 DIAGNOSIS — Z1231 Encounter for screening mammogram for malignant neoplasm of breast: Secondary | ICD-10-CM

## 2019-11-24 IMAGING — MG DIGITAL SCREENING BILAT W/ TOMO W/ CAD
8 series · 8 of 24 positions shown · non-contrast
Comparison: Previous exam(s).

CLINICAL DATA: Screening.

EXAM:
DIGITAL SCREENING BILATERAL MAMMOGRAM WITH TOMO AND CAD

[L CC synth-2D]
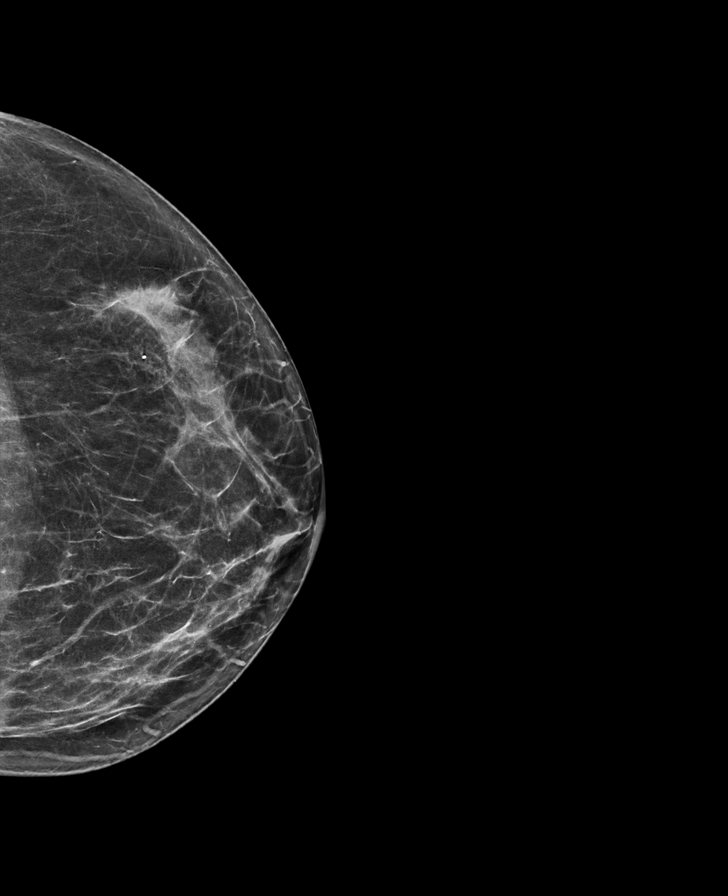

[R MLO synth-2D]
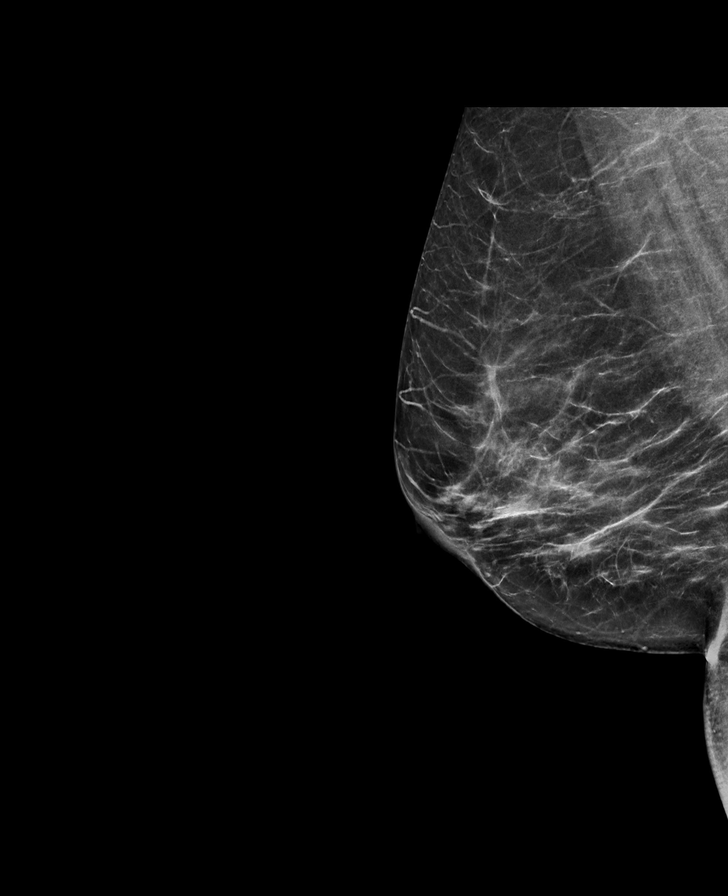

[L MLO synth-2D]
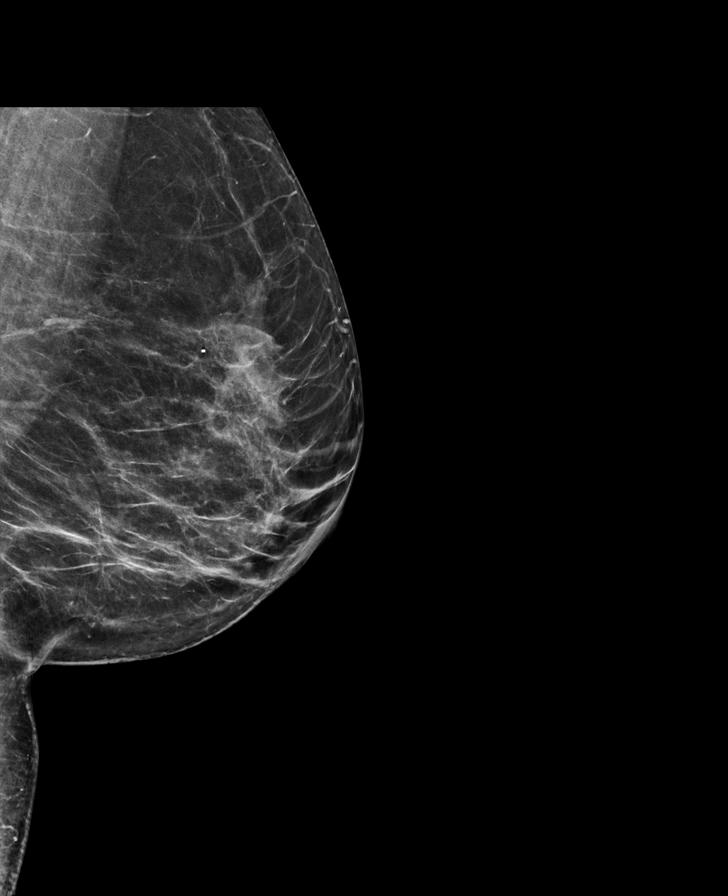

[R CC synth-2D]
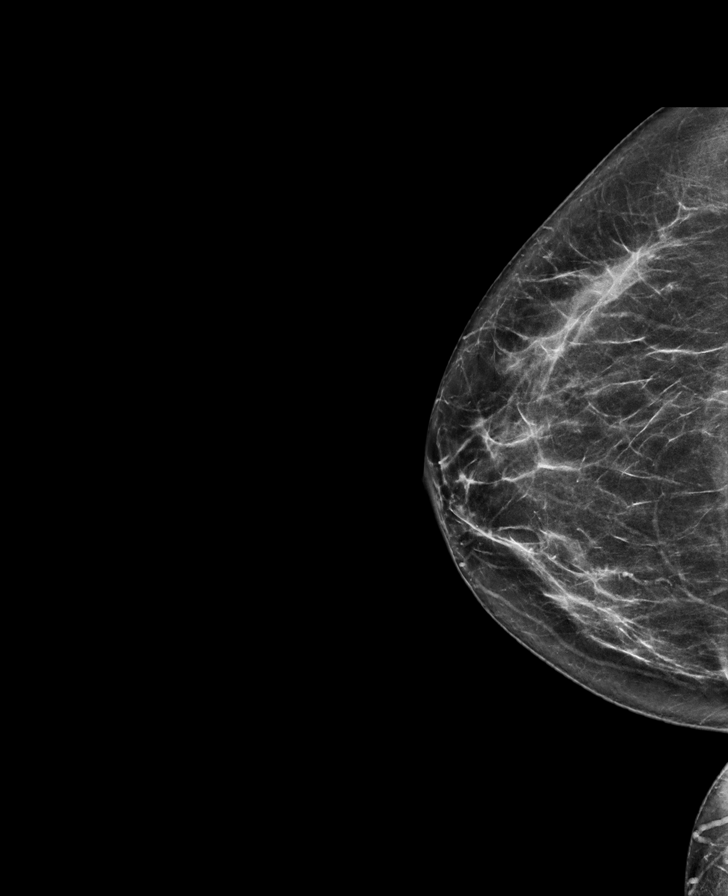

[R CC tomo · tomo slice 33/65.0]
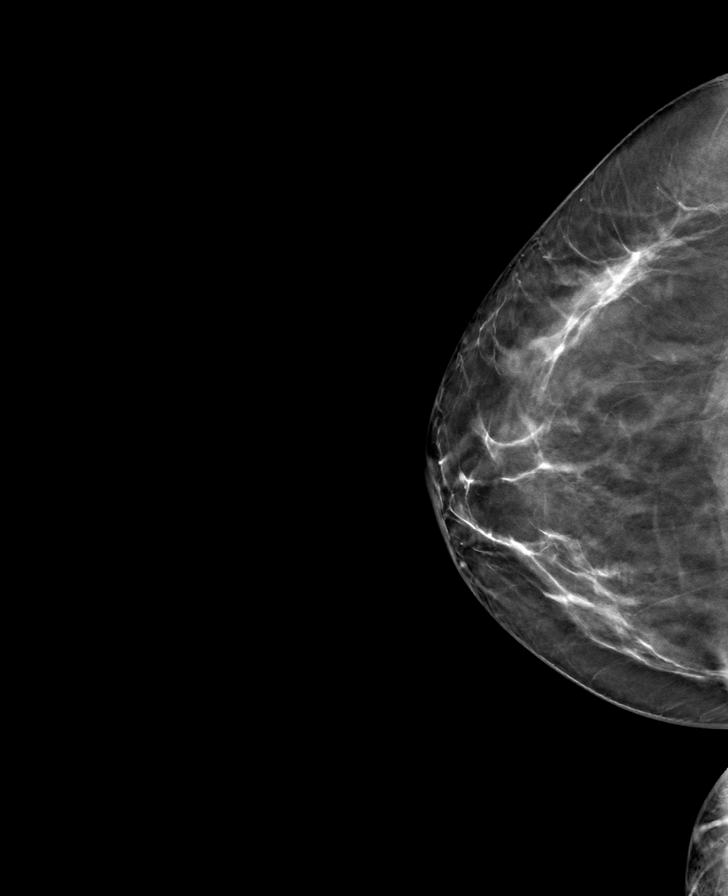

[L MLO tomo · tomo slice 34/67.0]
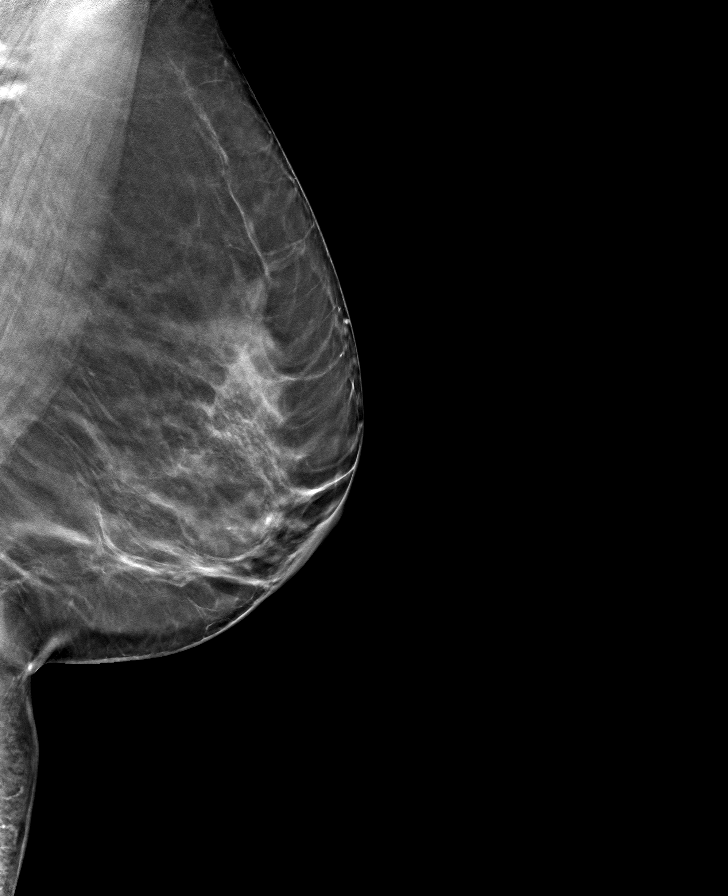

[R MLO tomo · tomo slice 34/67.0]
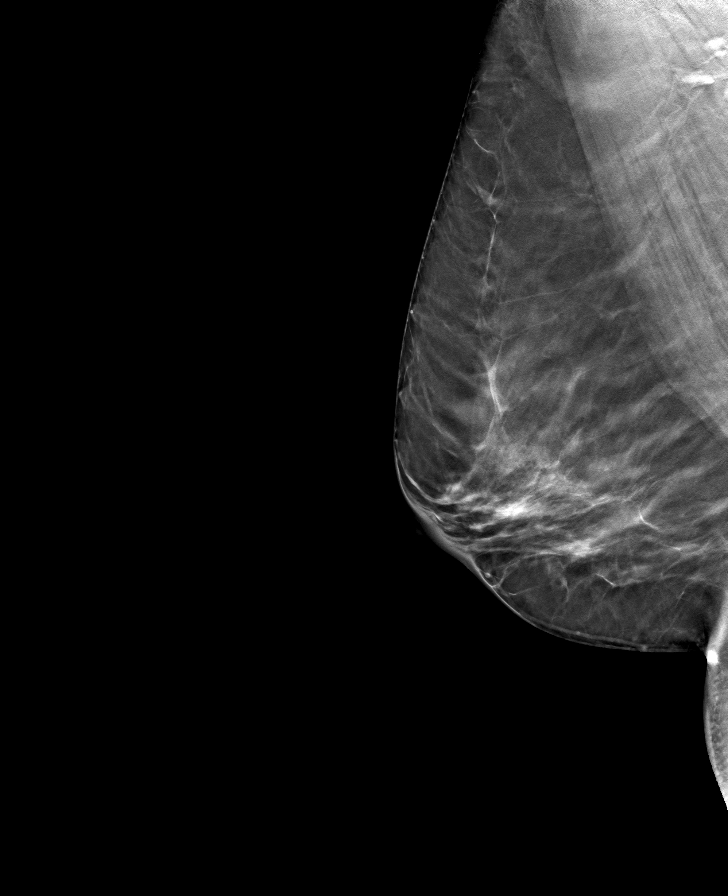

[L CC tomo · tomo slice 33/65.0]
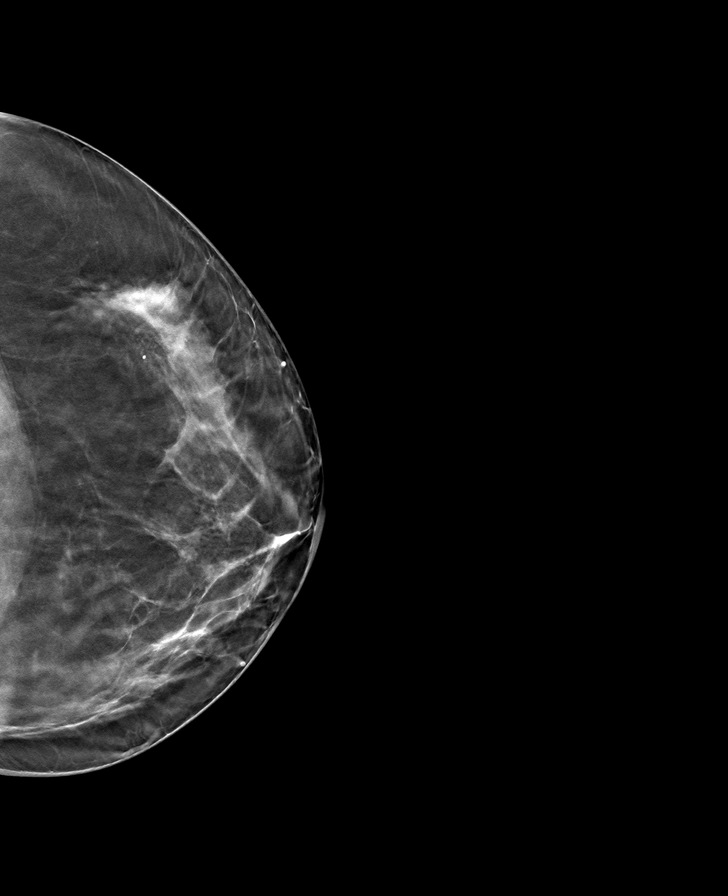

[8 of 24 positions shown; findings below may reference images not displayed]

ACR Breast Density Category b: There are scattered areas of
fibroglandular density.
FINDINGS: There are no findings suspicious for malignancy. Images were
processed with CAD.
IMPRESSION: No mammographic evidence of malignancy. A result letter of this
screening mammogram will be mailed directly to the patient.

RECOMMENDATION:
Screening mammogram in one year. (Code:[id number])

BI-RADS CATEGORY  1: Negative.

## 2020-08-09 ENCOUNTER — Other Ambulatory Visit: Payer: Self-pay | Admitting: Family Medicine

## 2020-08-09 DIAGNOSIS — Z1231 Encounter for screening mammogram for malignant neoplasm of breast: Secondary | ICD-10-CM

## 2020-09-22 ENCOUNTER — Ambulatory Visit: Payer: BC Managed Care – PPO

## 2020-10-28 ENCOUNTER — Other Ambulatory Visit: Payer: Self-pay

## 2020-10-28 ENCOUNTER — Ambulatory Visit: Payer: Self-pay
